# Patient Record
Sex: Male | Born: 1935 | Race: White | Hispanic: No | Marital: Married | Smoking: Never smoker
Health system: Southern US, Community
[De-identification: ages and names within clinical notes are randomized; demographics above are authoritative.]

## PROBLEM LIST (undated history)

## (undated) ENCOUNTER — Emergency Department: Discharge status: Absent without leave | Payer: Medicare Other

## (undated) DIAGNOSIS — N200 Calculus of kidney: Secondary | ICD-10-CM

## (undated) DIAGNOSIS — F0390 Unspecified dementia without behavioral disturbance: Secondary | ICD-10-CM

## (undated) HISTORY — PX: TONSILLECTOMY: SUR1361

## (undated) HISTORY — PX: CHOLECYSTECTOMY: SHX55

## (undated) HISTORY — PX: APPENDECTOMY: SHX54

---

## 2003-02-28 ENCOUNTER — Other Ambulatory Visit: Payer: Self-pay

## 2003-03-01 ENCOUNTER — Other Ambulatory Visit: Payer: Self-pay

## 2003-12-05 ENCOUNTER — Other Ambulatory Visit: Payer: Self-pay

## 2003-12-05 ENCOUNTER — Inpatient Hospital Stay: Payer: Self-pay | Admitting: Internal Medicine

## 2003-12-09 ENCOUNTER — Inpatient Hospital Stay (HOSPITAL_COMMUNITY): Admission: EM | Admit: 2003-12-09 | Discharge: 2003-12-12 | Payer: Self-pay | Admitting: Gastroenterology

## 2004-08-15 ENCOUNTER — Ambulatory Visit: Payer: Self-pay | Admitting: Internal Medicine

## 2005-12-28 ENCOUNTER — Emergency Department: Payer: Self-pay | Admitting: Emergency Medicine

## 2006-01-10 ENCOUNTER — Emergency Department: Payer: Self-pay | Admitting: General Practice

## 2006-01-19 ENCOUNTER — Emergency Department: Payer: Self-pay | Admitting: General Practice

## 2014-11-09 ENCOUNTER — Other Ambulatory Visit: Payer: Self-pay | Admitting: Family Medicine

## 2014-11-09 DIAGNOSIS — R413 Other amnesia: Secondary | ICD-10-CM

## 2014-11-19 ENCOUNTER — Ambulatory Visit
Admission: RE | Admit: 2014-11-19 | Discharge: 2014-11-19 | Disposition: A | Discharge status: Home / Self Care | Payer: Medicare Other | Source: Ambulatory Visit | Attending: Family Medicine | Admitting: Family Medicine

## 2014-11-19 DIAGNOSIS — R9082 White matter disease, unspecified: Secondary | ICD-10-CM | POA: Insufficient documentation

## 2014-11-19 DIAGNOSIS — I679 Cerebrovascular disease, unspecified: Secondary | ICD-10-CM | POA: Insufficient documentation

## 2014-11-19 DIAGNOSIS — R413 Other amnesia: Secondary | ICD-10-CM | POA: Insufficient documentation

## 2019-09-03 ENCOUNTER — Other Ambulatory Visit: Payer: Self-pay | Admitting: Family Medicine

## 2019-09-03 DIAGNOSIS — R3129 Other microscopic hematuria: Secondary | ICD-10-CM

## 2019-09-03 DIAGNOSIS — R1032 Left lower quadrant pain: Secondary | ICD-10-CM

## 2019-09-25 ENCOUNTER — Ambulatory Visit
Admission: RE | Admit: 2019-09-25 | Discharge: 2019-09-25 | Disposition: A | Discharge status: Home / Self Care | Payer: Medicare Other | Source: Ambulatory Visit | Attending: Family Medicine | Admitting: Family Medicine

## 2019-09-25 ENCOUNTER — Other Ambulatory Visit: Payer: Self-pay

## 2019-09-25 DIAGNOSIS — R1032 Left lower quadrant pain: Secondary | ICD-10-CM | POA: Insufficient documentation

## 2019-09-25 DIAGNOSIS — R3129 Other microscopic hematuria: Secondary | ICD-10-CM

## 2019-10-08 ENCOUNTER — Emergency Department: Payer: Medicare Other

## 2019-10-08 ENCOUNTER — Encounter: Payer: Self-pay | Admitting: Emergency Medicine

## 2019-10-08 ENCOUNTER — Other Ambulatory Visit: Payer: Self-pay

## 2019-10-08 ENCOUNTER — Emergency Department
Admission: EM | Admit: 2019-10-08 | Discharge: 2019-10-08 | Disposition: A | Discharge status: Home / Self Care | Payer: Medicare Other | Attending: Emergency Medicine | Admitting: Emergency Medicine

## 2019-10-08 DIAGNOSIS — Z79899 Other long term (current) drug therapy: Secondary | ICD-10-CM | POA: Diagnosis not present

## 2019-10-08 DIAGNOSIS — I700 Atherosclerosis of aorta: Secondary | ICD-10-CM | POA: Diagnosis not present

## 2019-10-08 DIAGNOSIS — W19XXXA Unspecified fall, initial encounter: Secondary | ICD-10-CM | POA: Diagnosis not present

## 2019-10-08 DIAGNOSIS — N200 Calculus of kidney: Secondary | ICD-10-CM | POA: Diagnosis not present

## 2019-10-08 DIAGNOSIS — F0390 Unspecified dementia without behavioral disturbance: Secondary | ICD-10-CM | POA: Insufficient documentation

## 2019-10-08 DIAGNOSIS — K5790 Diverticulosis of intestine, part unspecified, without perforation or abscess without bleeding: Secondary | ICD-10-CM | POA: Diagnosis not present

## 2019-10-08 DIAGNOSIS — S32010A Wedge compression fracture of first lumbar vertebra, initial encounter for closed fracture: Secondary | ICD-10-CM | POA: Diagnosis not present

## 2019-10-08 DIAGNOSIS — K449 Diaphragmatic hernia without obstruction or gangrene: Secondary | ICD-10-CM | POA: Insufficient documentation

## 2019-10-08 DIAGNOSIS — S34109A Unspecified injury to unspecified level of lumbar spinal cord, initial encounter: Secondary | ICD-10-CM | POA: Diagnosis present

## 2019-10-08 DIAGNOSIS — N1330 Unspecified hydronephrosis: Secondary | ICD-10-CM | POA: Insufficient documentation

## 2019-10-08 DIAGNOSIS — N400 Enlarged prostate without lower urinary tract symptoms: Secondary | ICD-10-CM | POA: Insufficient documentation

## 2019-10-08 HISTORY — DX: Calculus of kidney: N20.0

## 2019-10-08 HISTORY — DX: Unspecified dementia, unspecified severity, without behavioral disturbance, psychotic disturbance, mood disturbance, and anxiety: F03.90

## 2019-10-08 LAB — URINALYSIS, COMPLETE (UACMP) WITH MICROSCOPIC
Bilirubin Urine: NEGATIVE
Glucose, UA: NEGATIVE mg/dL
Ketones, ur: 20 mg/dL — AB
Nitrite: NEGATIVE
Protein, ur: 30 mg/dL — AB
RBC / HPF: >50 RBC/hpf — ABNORMAL HIGH (ref 0–5)
Specific Gravity, Urine: 1.025 (ref 1.005–1.030)
pH: 5 (ref 5.0–8.0)

## 2019-10-08 LAB — CBC WITH DIFFERENTIAL/PLATELET
Abs Immature Granulocytes: 0.18 10*3/uL — ABNORMAL HIGH (ref 0.00–0.07)
Basophils Absolute: 0 10*3/uL (ref 0.0–0.1)
Basophils Relative: 0 %
Eosinophils Absolute: 0 10*3/uL (ref 0.0–0.5)
Eosinophils Relative: 0 %
HCT: 55.5 % — ABNORMAL HIGH (ref 39.0–52.0)
Hemoglobin: 18.4 g/dL — ABNORMAL HIGH (ref 13.0–17.0)
Immature Granulocytes: 1 %
Lymphocytes Relative: 6 %
Lymphs Abs: 0.9 10*3/uL (ref 0.7–4.0)
MCH: 30.5 pg (ref 26.0–34.0)
MCHC: 33.2 g/dL (ref 30.0–36.0)
MCV: 92 fL (ref 80.0–100.0)
Monocytes Absolute: 1.5 10*3/uL — ABNORMAL HIGH (ref 0.1–1.0)
Monocytes Relative: 10 %
Neutro Abs: 13.3 10*3/uL — ABNORMAL HIGH (ref 1.7–7.7)
Neutrophils Relative %: 83 %
Platelets: 248 10*3/uL (ref 150–400)
RBC: 6.03 MIL/uL — ABNORMAL HIGH (ref 4.22–5.81)
RDW: 13.6 % (ref 11.5–15.5)
WBC: 15.9 10*3/uL — ABNORMAL HIGH (ref 4.0–10.5)
nRBC: 0 % (ref 0.0–0.2)

## 2019-10-08 LAB — COMPREHENSIVE METABOLIC PANEL
ALT: 31 U/L (ref 0–44)
AST: 46 U/L — ABNORMAL HIGH (ref 15–41)
Albumin: 4.5 g/dL (ref 3.5–5.0)
Alkaline Phosphatase: 72 U/L (ref 38–126)
Anion gap: 16 — ABNORMAL HIGH (ref 5–15)
BUN: 21 mg/dL (ref 8–23)
CO2: 19 mmol/L — ABNORMAL LOW (ref 22–32)
Calcium: 9.6 mg/dL (ref 8.9–10.3)
Chloride: 100 mmol/L (ref 98–111)
Creatinine, Ser: 1.38 mg/dL — ABNORMAL HIGH (ref 0.61–1.24)
GFR calc Af Amer: 53 mL/min — ABNORMAL LOW (ref >60)
GFR calc non Af Amer: 46 mL/min — ABNORMAL LOW (ref >60)
Glucose, Bld: 225 mg/dL — ABNORMAL HIGH (ref 70–99)
Potassium: 4.8 mmol/L (ref 3.5–5.1)
Sodium: 135 mmol/L (ref 135–145)
Total Bilirubin: 1.5 mg/dL — ABNORMAL HIGH (ref 0.3–1.2)
Total Protein: 8.6 g/dL — ABNORMAL HIGH (ref 6.5–8.1)

## 2019-10-08 IMAGING — CT CT RENAL STONE PROTOCOL
2 of 4 series · 15 of 46 positions shown, 17 images · non-contrast
Comparison: [DATE]

CLINICAL DATA: Fall, history kidney stones, dementia

EXAM:
CT ABDOMEN AND PELVIS WITHOUT CONTRAST
TECHNIQUE: Multidetector CT imaging of the abdomen and pelvis was performed
following the standard protocol without IV contrast. Sagittal and
coronal MPR images reconstructed from axial data set. Oral contrast
not administered for this indication.

[Series 2: stone full standard · axial · 0.74mm/px · z∈[-405,+50]mm · 12 of 101 slices shown, 14 images]
[im 5/101  soft-tissue]
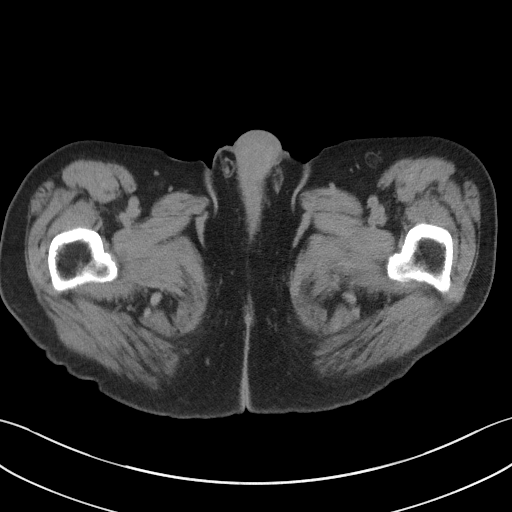
[im 5/101  bone]
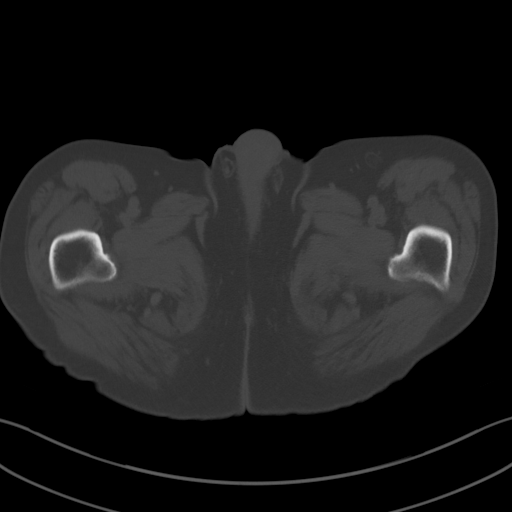
[im 14/101  soft-tissue]
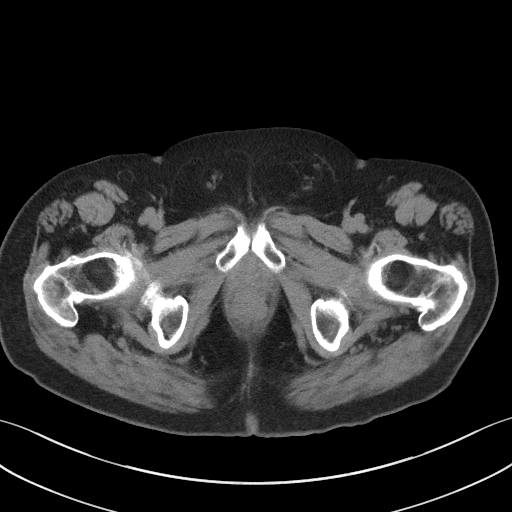
[im 22/101  soft-tissue]
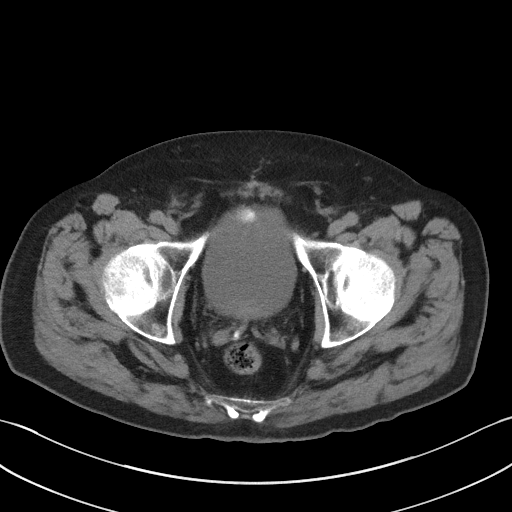
[im 31/101  soft-tissue]
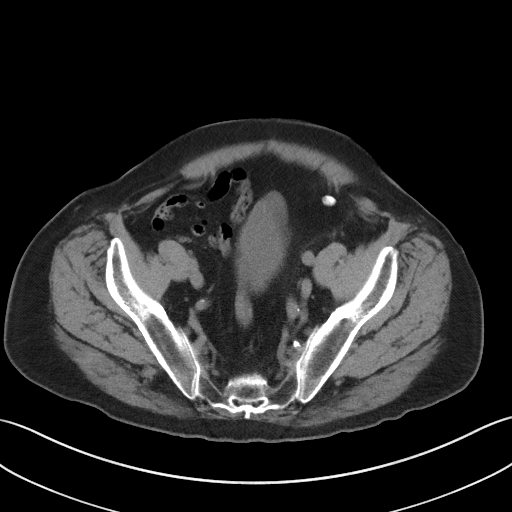
[im 40/101  soft-tissue]
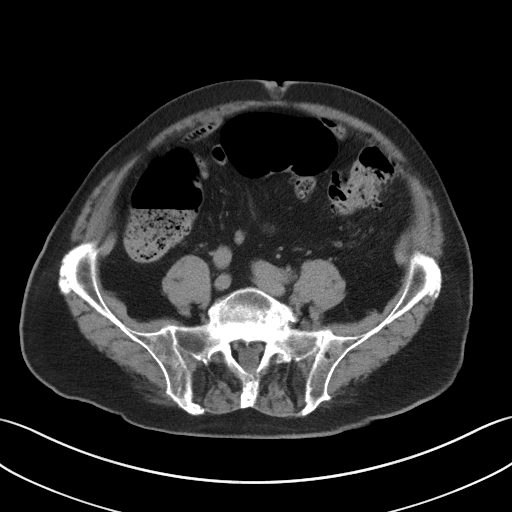
[im 48/101  soft-tissue]
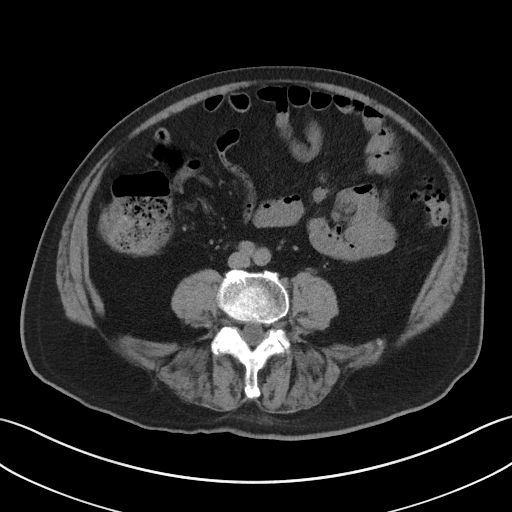
[im 53/101  soft-tissue]
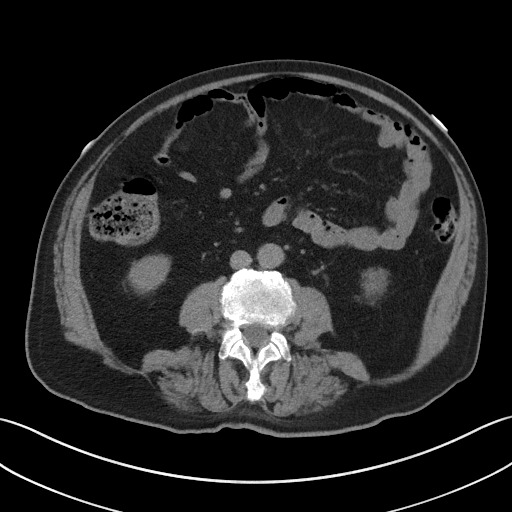
[im 61/101  soft-tissue]
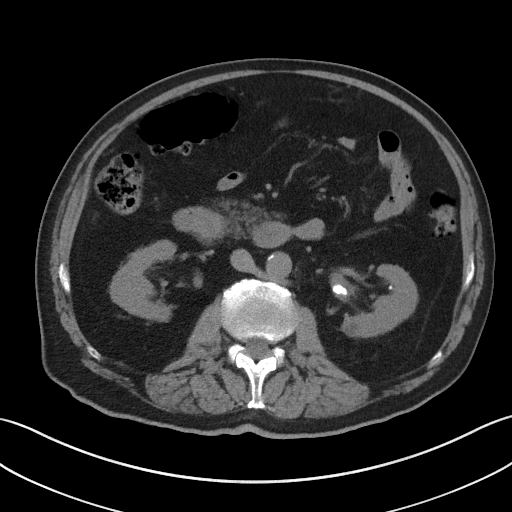
[im 70/101  soft-tissue]
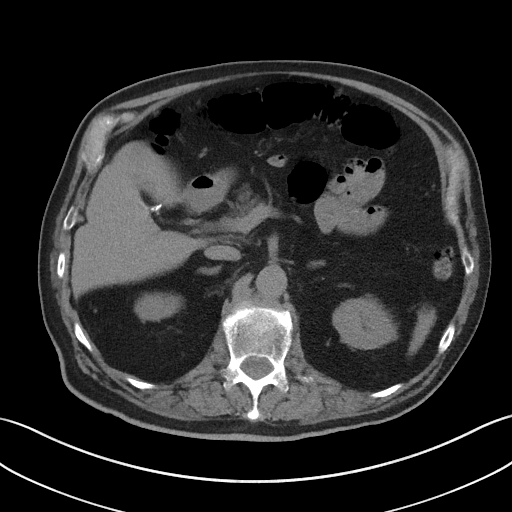
[im 70/101  bone]
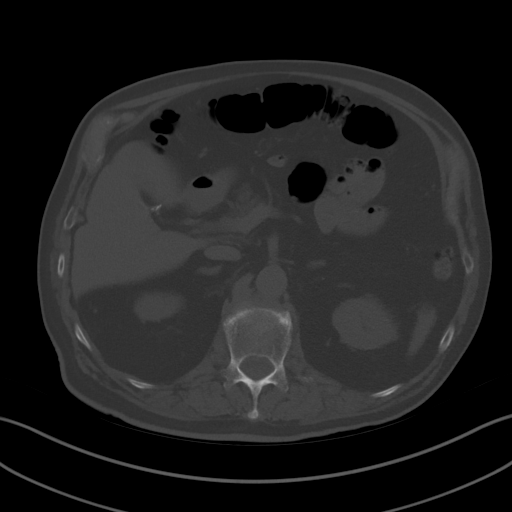
[im 79/101  soft-tissue]
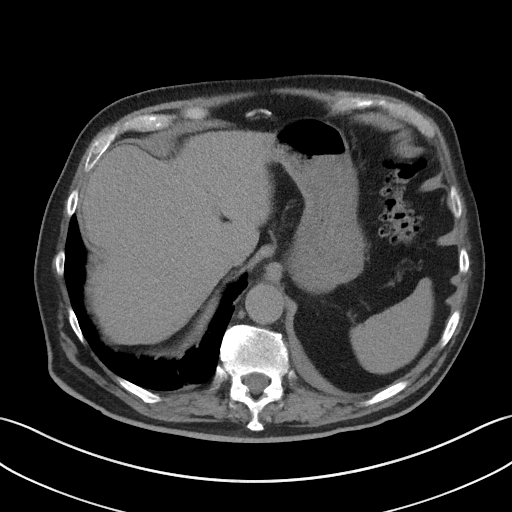
[im 87/101  soft-tissue]
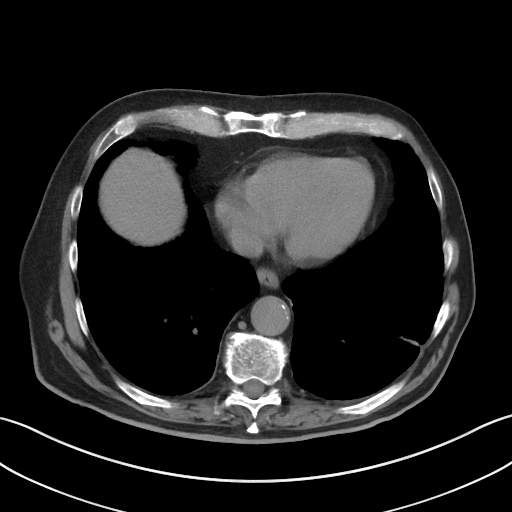
[im 96/101  soft-tissue]
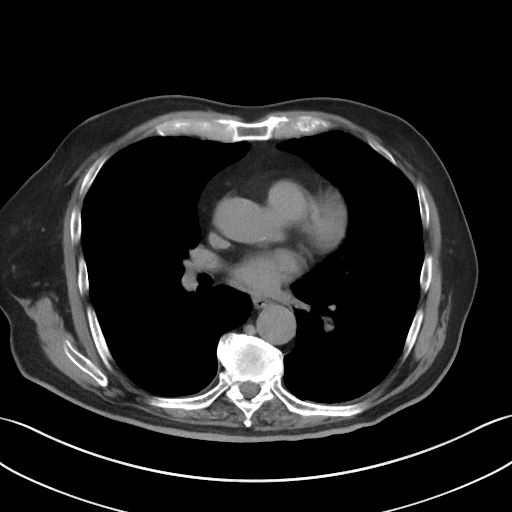

[Series 5: coronal · coronal · 0.75mm/px · 3 of 145 slices shown]
[im 49/145  soft-tissue]
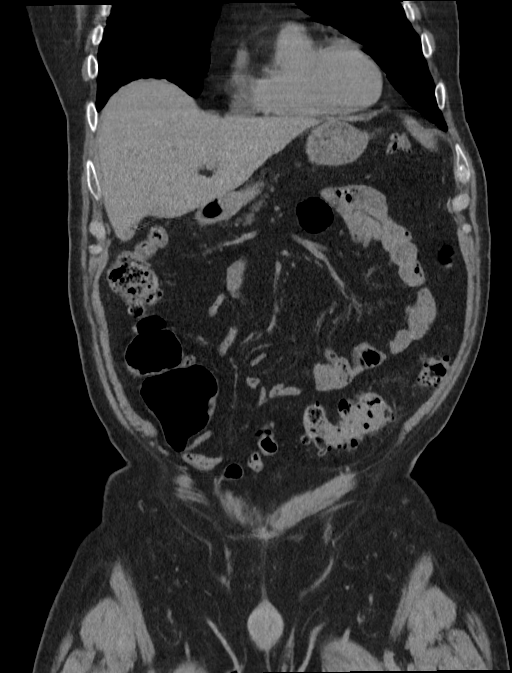
[im 65/145  soft-tissue]
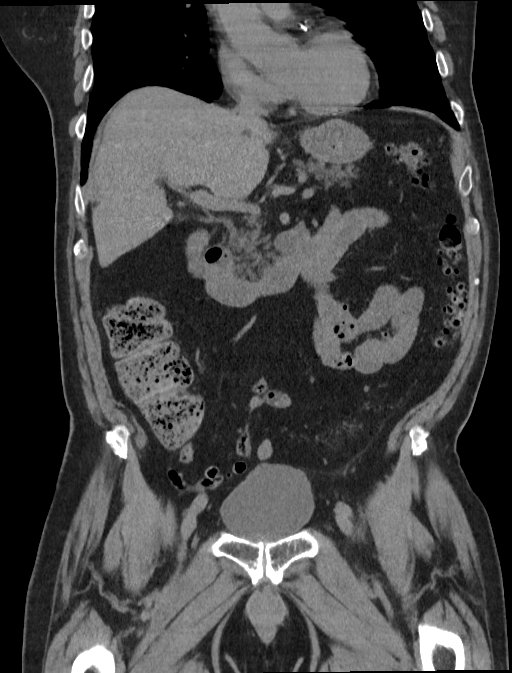
[im 81/145  soft-tissue]
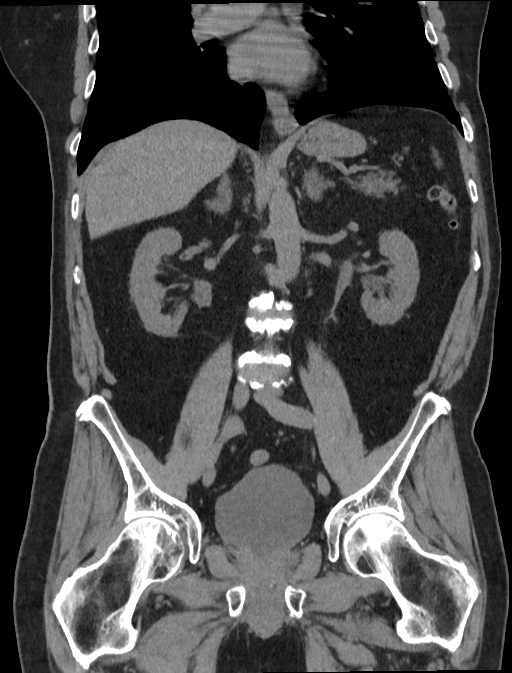

[15 of 46 positions shown; findings below may reference images not displayed]

FINDINGS: Lower chest: Linear subsegmental atelectasis BILATERAL lower lobes
greater on LEFT

Hepatobiliary: Gallbladder surgically absent.  Liver unremarkable.

Pancreas: Normal appearance

Spleen: Normal appearance

Adrenals/Urinary Tract: Adrenal glands normal appearance. No renal
mass. Multiple BILATERAL renal calculi including and 11 x 6 x 13 mm
calculus at the LEFT renal pelvis and a 7 x 5 x 8 mm calculus at the
RIGHT renal pelvis. Mild LEFT hydronephrosis. No ureteral
calcification or dilatation. Bladder unremarkable.

Stomach/Bowel: Diverticulosis of descending and sigmoid colon.
Minimal pericolic infiltration at proximal sigmoid colon, appears
chronic, without associated colonic wall thickening. Normal appendix
best appreciated on coronal images adjacent to cecum. Small hiatal
hernia. Remaining stomach and bowel loops normal appearance.

Vascular/Lymphatic: Atherosclerotic calcifications aorta and iliac
arteries without aneurysm. Scattered pelvic phleboliths. No
adenopathy. Mild coronary arterial calcifications. Retroaortic LEFT
renal vein.

Reproductive: Prostatic enlargement gland measuring 4.9 x 4.8 cm
image 85, with central low attenuation question prior TURP.

Other: Tiny umbilical hernia containing fat. No free air or free
fluid.

Musculoskeletal: Osseous demineralization. New superior endplate
compression fracture of L1 vertebral body with minimal anterior
height loss and minimal retropulsion of a posterior endplate
fragment superiorly.
IMPRESSION: Multiple BILATERAL renal calculi including a 7 x 5 x 8 mm calculus
at the RIGHT renal pelvis and a 11 x 6 x 13 mm calculus at the LEFT
renal pelvis with associated mild LEFT hydronephrosis.

Distal colonic diverticulosis with minimal pericolic infiltration at
proximal sigmoid colon, appears chronic, without associated colonic
wall thickening to suggest acute diverticulitis.

Prostatic enlargement with question prior TURP.

Small hiatal hernia.

New superior endplate compression fracture of L1 vertebral body with
minimal anterior height loss and minimal retropulsion of a posterior
endplate fragment superiorly.

Aortic Atherosclerosis ([ZU]-[ZU]).

## 2019-10-08 MED ORDER — TRAMADOL HCL 50 MG PO TABS
50.0000 mg | ORAL_TABLET | Freq: Once | ORAL | Status: AC
  Administered 2019-10-08: 50 mg via ORAL
  Filled 2019-10-08: qty 1

## 2019-10-08 MED ORDER — SODIUM CHLORIDE 0.9 % IV BOLUS
1000.0000 mL | Freq: Once | INTRAVENOUS | Status: AC
  Administered 2019-10-08: 1000 mL via INTRAVENOUS

## 2019-10-08 MED ORDER — TRAMADOL HCL 50 MG PO TABS: 50.0000 mg | ORAL_TABLET | Freq: Four times a day (QID) | ORAL | 0 refills | Status: AC | PRN

## 2019-10-08 NOTE — ED Notes (Signed)
Urine cup given, he is not able to urinate at this time.

## 2019-10-08 NOTE — ED Triage Notes (Signed)
Pt in via EMS from home with c/o fall on Tuesday and back pain. Per family pt not able to move around like he could. ST 126, Hx of dementia and kidney stones.

## 2019-10-08 NOTE — ED Provider Notes (Signed)
[facility] Emergency Department Provider Note  Time seen: 12:50 PM  I have reviewed the triage vital signs and the nursing notes.   HISTORY  Chief Complaint Fall and Back Pain   HPI Harold Barber is a 86 y.o. male with a past medical history of dementia, presents emergency department for back pain.  According to report patient is coming from home via EMS where he had a fall on Tuesday.  Since that time he has been complaining of increased back pain and having difficulty moving around due to back pain.  No report of head injury.  Here the patient is awake alert, he does have dementia when asked why he is here he states he is not sure.  Of asked if he is having back pain he states yes "because you have not fed me."   Patient does not appear to be in any discomfort.  Is currently in a wheelchair but is able to get up with assistance to sit in the bed.  Past Medical History:  Diagnosis Date  . Dementia (HCC)   . Kidney stones     There are no problems to display for this patient.   History reviewed. No pertinent surgical history.  Prior to Admission medications   Medication Sig Start Date End Date Taking? Authorizing Provider  donepezil (ARICEPT) 5 MG tablet Take 5 mg by mouth at bedtime.   Yes [provider]  memantine (NAMENDA) 5 MG tablet Take 5 mg by mouth 2 (two) times daily.   Yes [provider]    No Known Allergies  No family history on file.  Social History Social History   Tobacco Use  . Smoking status: Unknown If Ever Smoked  . Smokeless tobacco: Never Used  Substance Use Topics  . Alcohol use: Not on file  . Drug use: Not on file    Review of Systems Unable to obtain adequate/accurate review of systems secondary to baseline dementia.  ____________________________________________   PHYSICAL EXAM:  VITAL SIGNS: ED Triage Vitals  Enc Vitals Group     BP 10/08/19 0916 113/77     Pulse Rate 10/08/19 0916 (!) 126      Resp 10/08/19 0916 18     Temp 10/08/19 0916 98.1 F (36.7 C)     Temp Source 10/08/19 0916 Oral     SpO2 10/08/19 0916 94 %     Weight 10/08/19 0924 175 lb (79.4 kg)     Height 10/08/19 0924 5\' 10"  (1.778 m)     Head Circumference --      Peak Flow --      Pain Score 10/08/19 0923 8     Pain Loc --      Pain Edu? --      Excl. in GC? --    Constitutional: Patient is awake alert, no acute distress.  Is asking for something to eat. Eyes: Normal exam ENT      Head: Normocephalic and atraumatic.      Mouth/Throat: Mucous membranes are moist. Cardiovascular: Normal rate, regular rhythm around 100 bpm.  Respiratory: Normal respiratory effort without tachypnea nor retractions. Breath sounds are clear Gastrointestinal: Soft and nontender. No distention. Musculoskeletal: No back tenderness to palpation, no step-offs or deformities. Neurologic:  Normal speech and language. No gross focal neurologic deficits  Skin:  Skin is warm, dry and intact.  Psychiatric: Mood and affect are normal.  ____________________________________________    EKG  EKG viewed and interpreted by myself shows  sinus tachycardia 124 bpm with a narrow QRS, normal axis, normal intervals, nonspecific ST changes.  ____________________________________________    RADIOLOGY  L1 compression fracture.  ____________________________________________   INITIAL IMPRESSION / ASSESSMENT AND PLAN / ED COURSE  Pertinent labs & imaging results that were available during my care of the patient were reviewed by me and considered in my medical decision making (see chart for details).   Patient presents emergency department for reported back pain after a fall on Tuesday.  Patient has dementia and cannot contribute meaningfully to his history or review of systems.  Does not appear to have any tenderness on back palpation no step-offs or deformities.  No abdominal tenderness no chest tenderness.  However given the complaint of  worsening back pain at home with difficulty ambulating we will obtain thoracic x-ray as well as a CT renal scan to evaluate patient's back and pelvis.  Patient does have an anion gap of 16 slight renal insufficiency we will IV hydrate while awaiting results.  Patient is receiving IV fluids.  CT scan does show an L1 compression fracture.  I spoke to the wife regarding this, we will prescribe a short course of tramadol for pain management.  We will await urinalysis as well.  Offered admission however the wife does state she believes she can care for the patient adequately at home, which I believe is appropriate if she is willing.  Did discuss return precautions.  Patient care signed out to Dr. Scotty Court urinalysis pending.  Harold Barber was evaluated in Emergency Department on 10/08/2019 for the symptoms described in the history of present illness. He was evaluated in the context of the global COVID-19 pandemic, which necessitated consideration that the patient might be at risk for infection with the SARS-CoV-2 virus that causes COVID-19. Institutional protocols and algorithms that pertain to the evaluation of patients at risk for COVID-19 are in a state of rapid change based on information released by regulatory bodies including the CDC and federal and state organizations. These policies and algorithms were followed during the patient's care in the ED.  ____________________________________________   FINAL CLINICAL IMPRESSION(S) / ED DIAGNOSES  Compression fracture   Minna Antis, MD 10/08/19 1512

## 2019-10-08 NOTE — ED Notes (Signed)
Pt in XRAY 

## 2019-10-08 NOTE — ED Notes (Signed)
Rainbow sent to the lab.  

## 2019-10-08 NOTE — ED Notes (Signed)
Pt taken to restroom, attempted to provide urine sample, unable to at this time.  Pt oriented to person only.  Not answering questions appropriately, unable to answer why presents to ED

## 2019-10-08 NOTE — ED Provider Notes (Signed)
Procedures     ----------------------------------------- 4:29 PM on 10/08/2019 -----------------------------------------   Urinalysis negative for infection.  Patient's presentation appears to be due to dehydration and pain related to his mild L1 compression fracture.  Not septic.  CT also shows large bilateral kidney stone on both sides causing some microscopic hematuria, not requiring urgent intervention without signs of infection, creatinine at baseline.  Will recommend follow-up with urology in addition to orthopedics.   Sharman Cheek, MD 10/08/19 1630

## 2019-10-08 NOTE — Discharge Instructions (Addendum)
Please call the number for orthopedics to arrange a follow-up appointment in the next week for recheck/reevaluation.  Return to the emergency department for any worsening pain, inability to ambulate, or any other symptom personally concerning to yourself or family member.  Please take pain medication as needed, but only as prescribed.  You should follow up with urology as well for further evaluation of large kidney stones seen on both sides that are causing some blood in the urine.  Your urine test does not show any signs of infection.

## 2019-10-09 ENCOUNTER — Encounter: Payer: Self-pay | Admitting: Urology

## 2019-10-09 ENCOUNTER — Ambulatory Visit (INDEPENDENT_AMBULATORY_CARE_PROVIDER_SITE_OTHER): Payer: Medicare Other | Admitting: Urology

## 2019-10-09 VITALS — BP 111/79 | HR 123 | Ht 65.0 in | Wt 160.0 lb

## 2019-10-09 DIAGNOSIS — N200 Calculus of kidney: Secondary | ICD-10-CM

## 2019-10-09 NOTE — Progress Notes (Signed)
10/09/2019 2:19 PM   Harold Barber October 14, 1930 [id number]  Referring provider: Gayland Curry, MD [address] [place],  [place] [address] Chief Complaint  Patient presents with  . New Patient (Initial Visit)    ED follow-up for kidney stones    HPI: Harold Barber is a 86 y.o. male who presents today for evaluation and management of kidney stones. The patient is poor historian secondary to dementia. Wife is main historian and accompanies him today along with their daughter.   CT renal stone study on 09/25/2019 revealed bilateral nephrolithiasis. No hydronephrosis or renal obstruction is noted. Diverticulosis is noted throughout the colon. Mild inflammatory changes are noted around the junction of the descending and sigmoid colon suggesting mild focal diverticulitis. Mild prostatic enlargement.  He was presented to the ED yesterday for back pain secondary to a fall. He had increased back pain and having difficulty moving around due to back pain. Patient did have an anion gap of 16 slight renal insufficiency and was treated with IV fluids. UA noted large hematuria, >50 RBC, 6-10 WBC, rare bacteria and hyaline casts present. Bilateral kidney stones found on CT were thought to be causing some microscopic hematuria. Patient was treated with short course tramadol.  CT renal stone study 10/08/2019 noted multiple BILATERAL renal calculi including a 7 x 5 x 8 mm calculus at the RIGHT renal pelvis and a 11 x 6 x 13 mm calculus at the LEFT renal pelvis with associated mild LEFT hydronephrosis. New superior endplate compression fracture of L1 vertebral body with minimal anterior height loss and minimal retropulsion of a posterior endplate fragment superiorly.  His wife noticed some improvement after he received IV fluids yesterday. She notes that he has had blood in his urine for a while now but its has not been frankly red, more darker or brownish possibly even from concentration rather than gross  blood. He is in pain related to compression fracture of L1 vertebrae s/p fall.   Patient is on Flomax per PCP to help pass stones.   PMH: Past Medical History:  Diagnosis Date  . Dementia ([place])   . Kidney stones     Surgical History: No past surgical history on file.  Home Medications:  Allergies as of 10/09/2019      Reactions   Statins    Other reaction(s): Other (See Comments) Medically inappropriate. Advanced dementia      Medication List       Accurate as of October 09, 2019 11:59 PM. If you have any questions, ask your nurse or doctor.        donepezil 10 MG tablet Commonly known as: ARICEPT Take 1 tablet by mouth daily. What changed: Another medication with the same name was removed. Continue taking this medication, and follow the directions you see here. Changed by: Hollice Espy, MD   memantine 5 MG tablet Commonly known as: NAMENDA Take 5 mg by mouth 2 (two) times daily.   metroNIDAZOLE 500 MG tablet Commonly known as: FLAGYL Take 1 tablet by mouth in the morning, at noon, and at bedtime.   sulfamethoxazole-trimethoprim 800-160 MG tablet Commonly known as: BACTRIM DS Take 1 tablet by mouth in the morning and at bedtime.   tamsulosin 0.4 MG Caps capsule Commonly known as: FLOMAX Take 0.4 mg by mouth daily.   traMADol 50 MG tablet Commonly known as: Ultram Take 1 tablet (50 mg total) by mouth every 6 (six) hours as needed.       Allergies:  Allergies  Allergen Reactions  . Statins     Other reaction(s): Other (See Comments) Medically inappropriate. Advanced dementia    Family History: No family history on file.  Social History:  reports that he has never smoked. He has never used smokeless tobacco. He reports that he does not drink alcohol and does not use drugs.   Physical Exam: BP 111/79   Pulse (!) 123   Ht _0  (1.651 m)   Wt 160 lb (72.6 kg)   BMI 26.63 kg/m   Constitutional:. Frail appearing in wheelchair.  Not oriented.   Accompanied by wife and daughter.   HEENT: [place] AT, moist mucus membranes.  Trachea midline, no masses. Cardiovascular: No clubbing, cyanosis, or edema. Respiratory: Normal respiratory effort, no increased work of breathing. Skin: No rashes, bruises or suspicious lesions. Neurologic: Grossly intact, no focal deficits, moving all 4 extremities. Psychiatric: Disorientated.  Laboratory Data:  Lab Results  Component Value Date   CREATININE 1.38 (H) 10/08/2019     Pertinent Imaging:  Results for orders placed during the hospital encounter of 10/08/19  CT Renal Stone Study  Narrative CLINICAL DATA:  Fall, history kidney stones, dementia  EXAM: CT ABDOMEN AND PELVIS WITHOUT CONTRAST  TECHNIQUE: Multidetector CT imaging of the abdomen and pelvis was performed following the standard protocol without IV contrast. Sagittal and coronal MPR images reconstructed from axial data set. Oral contrast not administered for this indication.  COMPARISON:  09/25/2019  FINDINGS: Lower chest: Linear subsegmental atelectasis BILATERAL lower lobes greater on LEFT  Hepatobiliary: Gallbladder surgically absent.  Liver unremarkable.  Pancreas: Normal appearance  Spleen: Normal appearance  Adrenals/Urinary Tract: Adrenal glands normal appearance. No renal mass. Multiple BILATERAL renal calculi including and 11 x 6 x 13 mm calculus at the LEFT renal pelvis and a 7 x 5 x 8 mm calculus at the RIGHT renal pelvis. Mild LEFT hydronephrosis. No ureteral calcification or dilatation. Bladder unremarkable.  Stomach/Bowel: Diverticulosis of descending and sigmoid colon. Minimal pericolic infiltration at proximal sigmoid colon, appears chronic, without associated colonic wall thickening. Normal appendix best appreciated on coronal images adjacent to cecum. Small hiatal hernia. Remaining stomach and bowel loops normal appearance.  Vascular/Lymphatic: Atherosclerotic calcifications aorta and  iliac arteries without aneurysm. Scattered pelvic phleboliths. No adenopathy. Mild coronary arterial calcifications. Retroaortic LEFT renal vein.  Reproductive: Prostatic enlargement gland measuring 4.9 x 4.8 cm image 85, with central low attenuation question prior TURP.  Other: Tiny umbilical hernia containing fat. No free air or free fluid.  Musculoskeletal: Osseous demineralization. New superior endplate compression fracture of L1 vertebral body with minimal anterior height loss and minimal retropulsion of a posterior endplate fragment superiorly.  IMPRESSION: Multiple BILATERAL renal calculi including a 7 x 5 x 8 mm calculus at the RIGHT renal pelvis and a 11 x 6 x 13 mm calculus at the LEFT renal pelvis with associated mild LEFT hydronephrosis.  Distal colonic diverticulosis with minimal pericolic infiltration at proximal sigmoid colon, appears chronic, without associated colonic wall thickening to suggest acute diverticulitis.  Prostatic enlargement with question prior TURP.  Small hiatal hernia.  New superior endplate compression fracture of L1 vertebral body with minimal anterior height loss and minimal retropulsion of a posterior endplate fragment superiorly.  Aortic Atherosclerosis (ICD10-I70.0).   Electronically Signed By: Lavonia Dana M.D. On: 10/08/2019 13:49  I have personally reviewed the images and agree with radiologist interpretation. I personally measured the left renal stone and found it to be 1300 Hu. Stone to skin distance was 10  cm. I also shared images with patient and family.     Assessment & Plan:    1. Microscopic hematuria -Likely related to  stone -Discussed flexible cystoscopy but family deferred and they understand the risk of misdiagnosed bladder cancer. -Given his lack of symptoms, age and comorbidities the family would like to continue conservative management. They were advised to RTC if he has flank or abdominal pain, gross hematuria  and fever.   2. Bilateral nephrolithiasis -Discontinue Flomax, stones are not currently in ureter or amenable to MET -Cr near baseline, suspect intermittent obstruction  -Based on size and location of the stone, patient has ~0% of spontaneous interval stone passage over 30-day period. -We discussed general stone prevention techniques including drinking plenty water with goal of producing 2.5 L urine daily, increased citric acid intake, avoidance of high oxalate containing foods, and decreased salt intake.  Information about dietary recommendations given today.  -Surgical intervention was deferred at this time due to lack of symptoms and comorbidities the family would like to continue care with PCP Dr. Astrid Divine for now.  Alternatives including ESWL vs. URS were discussed, family wants to avoid surgery that is absolutely not necessary. -Offered surveillance of stone, declined  F/u prn  [facility] [address], [place], [place] [address] [phone]  I, Selena Batten, am acting as a scribe for Dr. Hollice Espy.  I have reviewed the above documentation for accuracy and completeness, and I agree with the above.   Hollice Espy, MD  I spent 45 total minutes on the day of the encounter including pre-visit review of the medical record, face-to-face time with the patient, and post visit ordering of labs/imaging/tests.

## 2019-10-13 ENCOUNTER — Inpatient Hospital Stay: Payer: Medicare Other | Admitting: Anesthesiology

## 2019-10-13 ENCOUNTER — Other Ambulatory Visit: Payer: Self-pay

## 2019-10-13 ENCOUNTER — Emergency Department: Payer: Medicare Other

## 2019-10-13 ENCOUNTER — Encounter
Admission: EM | Disposition: A | Discharge status: Skilled Nursing Facility | Payer: Self-pay | Source: Home / Self Care | Attending: Surgery

## 2019-10-13 ENCOUNTER — Inpatient Hospital Stay: Payer: Medicare Other

## 2019-10-13 ENCOUNTER — Encounter: Payer: Self-pay | Admitting: Emergency Medicine

## 2019-10-13 ENCOUNTER — Inpatient Hospital Stay
Admission: EM | Admit: 2019-10-13 | Discharge: 2019-10-20 | DRG: 329 | Disposition: A | Discharge status: Skilled Nursing Facility | Payer: Medicare Other | Attending: Surgery | Admitting: Surgery

## 2019-10-13 DIAGNOSIS — Z9049 Acquired absence of other specified parts of digestive tract: Secondary | ICD-10-CM | POA: Diagnosis not present

## 2019-10-13 DIAGNOSIS — R198 Other specified symptoms and signs involving the digestive system and abdomen: Secondary | ICD-10-CM

## 2019-10-13 DIAGNOSIS — E878 Other disorders of electrolyte and fluid balance, not elsewhere classified: Secondary | ICD-10-CM | POA: Diagnosis present

## 2019-10-13 DIAGNOSIS — Z9181 History of falling: Secondary | ICD-10-CM

## 2019-10-13 DIAGNOSIS — Z79899 Other long term (current) drug therapy: Secondary | ICD-10-CM | POA: Diagnosis not present

## 2019-10-13 DIAGNOSIS — F0280 Dementia in other diseases classified elsewhere without behavioral disturbance: Secondary | ICD-10-CM | POA: Diagnosis present

## 2019-10-13 DIAGNOSIS — I493 Ventricular premature depolarization: Secondary | ICD-10-CM | POA: Diagnosis present

## 2019-10-13 DIAGNOSIS — N200 Calculus of kidney: Secondary | ICD-10-CM | POA: Diagnosis present

## 2019-10-13 DIAGNOSIS — K5720 Diverticulitis of large intestine with perforation and abscess without bleeding: Secondary | ICD-10-CM | POA: Diagnosis present

## 2019-10-13 DIAGNOSIS — A419 Sepsis, unspecified organism: Secondary | ICD-10-CM | POA: Diagnosis not present

## 2019-10-13 DIAGNOSIS — G309 Alzheimer's disease, unspecified: Secondary | ICD-10-CM | POA: Diagnosis present

## 2019-10-13 DIAGNOSIS — Z7401 Bed confinement status: Secondary | ICD-10-CM | POA: Diagnosis not present

## 2019-10-13 DIAGNOSIS — R739 Hyperglycemia, unspecified: Secondary | ICD-10-CM | POA: Diagnosis present

## 2019-10-13 DIAGNOSIS — R4182 Altered mental status, unspecified: Secondary | ICD-10-CM

## 2019-10-13 DIAGNOSIS — Z4659 Encounter for fitting and adjustment of other gastrointestinal appliance and device: Secondary | ICD-10-CM

## 2019-10-13 DIAGNOSIS — E870 Hyperosmolality and hypernatremia: Secondary | ICD-10-CM | POA: Diagnosis present

## 2019-10-13 DIAGNOSIS — Z0189 Encounter for other specified special examinations: Secondary | ICD-10-CM

## 2019-10-13 DIAGNOSIS — Z888 Allergy status to other drugs, medicaments and biological substances status: Secondary | ICD-10-CM | POA: Diagnosis not present

## 2019-10-13 DIAGNOSIS — Z87442 Personal history of urinary calculi: Secondary | ICD-10-CM | POA: Diagnosis not present

## 2019-10-13 DIAGNOSIS — G9341 Metabolic encephalopathy: Secondary | ICD-10-CM | POA: Diagnosis present

## 2019-10-13 DIAGNOSIS — K669 Disorder of peritoneum, unspecified: Secondary | ICD-10-CM | POA: Diagnosis present

## 2019-10-13 DIAGNOSIS — E8770 Fluid overload, unspecified: Secondary | ICD-10-CM | POA: Diagnosis not present

## 2019-10-13 DIAGNOSIS — Z20822 Contact with and (suspected) exposure to covid-19: Secondary | ICD-10-CM | POA: Diagnosis present

## 2019-10-13 DIAGNOSIS — Z01818 Encounter for other preprocedural examination: Secondary | ICD-10-CM

## 2019-10-13 DIAGNOSIS — E872 Acidosis: Secondary | ICD-10-CM | POA: Diagnosis present

## 2019-10-13 DIAGNOSIS — R109 Unspecified abdominal pain: Secondary | ICD-10-CM | POA: Diagnosis present

## 2019-10-13 HISTORY — PX: COLECTOMY WITH COLOSTOMY CREATION/HARTMANN PROCEDURE: SHX6598

## 2019-10-13 HISTORY — PX: LAPAROTOMY: SHX154

## 2019-10-13 LAB — BASIC METABOLIC PANEL
Anion gap: 15 (ref 5–15)
BUN: 45 mg/dL — ABNORMAL HIGH (ref 8–23)
CO2: 21 mmol/L — ABNORMAL LOW (ref 22–32)
Calcium: 9.5 mg/dL (ref 8.9–10.3)
Chloride: 106 mmol/L (ref 98–111)
Creatinine, Ser: 1.08 mg/dL (ref 0.61–1.24)
GFR calc Af Amer: >60 mL/min (ref >60)
GFR calc non Af Amer: >60 mL/min (ref >60)
Glucose, Bld: 175 mg/dL — ABNORMAL HIGH (ref 70–99)
Potassium: 4.3 mmol/L (ref 3.5–5.1)
Sodium: 142 mmol/L (ref 135–145)

## 2019-10-13 LAB — URINALYSIS, COMPLETE (UACMP) WITH MICROSCOPIC
Bacteria, UA: NONE SEEN
Bilirubin Urine: NEGATIVE
Glucose, UA: NEGATIVE mg/dL
Ketones, ur: 5 mg/dL — AB
Leukocytes,Ua: NEGATIVE
Nitrite: NEGATIVE
Protein, ur: NEGATIVE mg/dL
Specific Gravity, Urine: 1.02 (ref 1.005–1.030)
Squamous Epithelial / HPF: NONE SEEN (ref 0–5)
pH: 5 (ref 5.0–8.0)

## 2019-10-13 LAB — CBC
HCT: 56.5 % — ABNORMAL HIGH (ref 39.0–52.0)
Hemoglobin: 19.4 g/dL — ABNORMAL HIGH (ref 13.0–17.0)
MCH: 31.2 pg (ref 26.0–34.0)
MCHC: 34.3 g/dL (ref 30.0–36.0)
MCV: 90.8 fL (ref 80.0–100.0)
Platelets: 285 10*3/uL (ref 150–400)
RBC: 6.22 MIL/uL — ABNORMAL HIGH (ref 4.22–5.81)
RDW: 13.7 % (ref 11.5–15.5)
WBC: 22.9 10*3/uL — ABNORMAL HIGH (ref 4.0–10.5)
nRBC: 0 % (ref 0.0–0.2)

## 2019-10-13 LAB — BLOOD GAS, ARTERIAL
Acid-base deficit: 8.8 mmol/L — ABNORMAL HIGH (ref 0.0–2.0)
Bicarbonate: 16.1 mmol/L — ABNORMAL LOW (ref 20.0–28.0)
FIO2: 0.4
MECHVT: 450 mL
Mechanical Rate: 18
O2 Saturation: 96.3 %
PEEP: 5 cmH2O
Patient temperature: 37
RATE: 18 resp/min
pCO2 arterial: 32 mmHg (ref 32.0–48.0)
pH, Arterial: 7.31 — ABNORMAL LOW (ref 7.350–7.450)
pO2, Arterial: 92 mmHg (ref 83.0–108.0)

## 2019-10-13 LAB — MRSA PCR SCREENING: MRSA by PCR: NEGATIVE

## 2019-10-13 LAB — PROCALCITONIN: Procalcitonin: 0.3 ng/mL

## 2019-10-13 LAB — APTT: aPTT: 27 seconds (ref 24–36)

## 2019-10-13 LAB — LACTIC ACID, PLASMA
Lactic Acid, Venous: 1.5 mmol/L (ref 0.5–1.9)
Lactic Acid, Venous: 2.4 mmol/L (ref 0.5–1.9)

## 2019-10-13 LAB — PROTIME-INR
INR: 1.1 (ref 0.8–1.2)
Prothrombin Time: 14.1 seconds (ref 11.4–15.2)

## 2019-10-13 LAB — SARS CORONAVIRUS 2 BY RT PCR (HOSPITAL ORDER, PERFORMED IN ~~LOC~~ HOSPITAL LAB): SARS Coronavirus 2: NEGATIVE

## 2019-10-13 SURGERY — LAPAROTOMY, EXPLORATORY
Anesthesia: General

## 2019-10-13 MED ORDER — MIDAZOLAM HCL 2 MG/2ML IJ SOLN
1.0000 mg | INTRAMUSCULAR | Status: DC | PRN
  Administered 2019-10-14 (×2): 1 mg via INTRAVENOUS
  Filled 2019-10-13 (×2): qty 2

## 2019-10-13 MED ORDER — SUCCINYLCHOLINE CHLORIDE 20 MG/ML IJ SOLN
INTRAMUSCULAR | Status: DC | PRN
  Administered 2019-10-13: 120 mg via INTRAVENOUS

## 2019-10-13 MED ORDER — DONEPEZIL HCL 5 MG PO TABS
10.0000 mg | ORAL_TABLET | Freq: Every day | ORAL | Status: DC
  Filled 2019-10-13: qty 2

## 2019-10-13 MED ORDER — ROCURONIUM BROMIDE 100 MG/10ML IV SOLN
INTRAVENOUS | Status: DC | PRN
  Administered 2019-10-13: 40 mg via INTRAVENOUS
  Administered 2019-10-13: 50 mg via INTRAVENOUS
  Administered 2019-10-13: 30 mg via INTRAVENOUS

## 2019-10-13 MED ORDER — MEMANTINE HCL 5 MG PO TABS
5.0000 mg | ORAL_TABLET | Freq: Two times a day (BID) | ORAL | Status: DC
  Administered 2019-10-14: 5 mg via ORAL
  Filled 2019-10-13 (×2): qty 1

## 2019-10-13 MED ORDER — SODIUM CHLORIDE 0.9 % IV SOLN: 2.0000 g | Freq: Two times a day (BID) | INTRAVENOUS | Status: DC

## 2019-10-13 MED ORDER — MORPHINE SULFATE (PF) 4 MG/ML IV SOLN
4.0000 mg | INTRAVENOUS | Status: DC | PRN
  Administered 2019-10-13: 4 mg via INTRAVENOUS
  Filled 2019-10-13: qty 1

## 2019-10-13 MED ORDER — SODIUM CHLORIDE 0.9 % IV SOLN
INTRAVENOUS | Status: DC | PRN
  Administered 2019-10-13: 60 mL

## 2019-10-13 MED ORDER — ROCURONIUM BROMIDE 10 MG/ML (PF) SYRINGE
PREFILLED_SYRINGE | INTRAVENOUS | Status: AC
  Filled 2019-10-13: qty 10

## 2019-10-13 MED ORDER — ORAL CARE MOUTH RINSE
15.0000 mL | OROMUCOSAL | Status: DC
  Administered 2019-10-14 – 2019-10-15 (×12): 15 mL via OROMUCOSAL

## 2019-10-13 MED ORDER — CHLORHEXIDINE GLUCONATE CLOTH 2 % EX PADS
6.0000 | MEDICATED_PAD | Freq: Every day | CUTANEOUS | Status: DC
  Administered 2019-10-13 – 2019-10-17 (×4): 6 via TOPICAL

## 2019-10-13 MED ORDER — SODIUM CHLORIDE 0.9 % IV SOLN: Freq: Once | INTRAVENOUS | Status: AC

## 2019-10-13 MED ORDER — DOCUSATE SODIUM 50 MG/5ML PO LIQD
100.0000 mg | Freq: Two times a day (BID) | ORAL | Status: DC
  Administered 2019-10-14: 100 mg via ORAL
  Filled 2019-10-13: qty 10

## 2019-10-13 MED ORDER — POLYETHYLENE GLYCOL 3350 17 G PO PACK
17.0000 g | PACK | Freq: Every day | ORAL | Status: DC
  Filled 2019-10-13: qty 1

## 2019-10-13 MED ORDER — LIDOCAINE HCL (PF) 2 % IJ SOLN
INTRAMUSCULAR | Status: AC
  Filled 2019-10-13: qty 5

## 2019-10-13 MED ORDER — DEXAMETHASONE SODIUM PHOSPHATE 10 MG/ML IJ SOLN
INTRAMUSCULAR | Status: DC | PRN
  Administered 2019-10-13: 10 mg via INTRAVENOUS

## 2019-10-13 MED ORDER — SODIUM CHLORIDE 0.9 % IV SOLN: INTRAVENOUS | Status: DC | PRN

## 2019-10-13 MED ORDER — PANTOPRAZOLE SODIUM 40 MG IV SOLR
40.0000 mg | Freq: Every day | INTRAVENOUS | Status: DC
  Administered 2019-10-14 – 2019-10-19 (×7): 40 mg via INTRAVENOUS
  Filled 2019-10-13 (×7): qty 40

## 2019-10-13 MED ORDER — DEXAMETHASONE SODIUM PHOSPHATE 10 MG/ML IJ SOLN
INTRAMUSCULAR | Status: AC
  Filled 2019-10-13: qty 1

## 2019-10-13 MED ORDER — FENTANYL BOLUS VIA INFUSION
25.0000 ug | INTRAVENOUS | Status: DC | PRN
  Filled 2019-10-13: qty 25

## 2019-10-13 MED ORDER — FENTANYL CITRATE (PF) 100 MCG/2ML IJ SOLN
INTRAMUSCULAR | Status: DC | PRN
Start: 2019-10-13 — End: 2019-10-13
  Administered 2019-10-13 (×5): 50 ug via INTRAVENOUS

## 2019-10-13 MED ORDER — SODIUM CHLORIDE 0.9 % IV BOLUS
1000.0000 mL | Freq: Once | INTRAVENOUS | Status: AC
  Administered 2019-10-13: 1000 mL via INTRAVENOUS

## 2019-10-13 MED ORDER — KETOROLAC TROMETHAMINE 30 MG/ML IJ SOLN
15.0000 mg | Freq: Four times a day (QID) | INTRAMUSCULAR | Status: AC
  Administered 2019-10-14 – 2019-10-17 (×14): 15 mg via INTRAVENOUS
  Filled 2019-10-13 (×14): qty 1

## 2019-10-13 MED ORDER — LACTATED RINGERS IV SOLN: INTRAVENOUS | Status: DC

## 2019-10-13 MED ORDER — FENTANYL CITRATE (PF) 250 MCG/5ML IJ SOLN
INTRAMUSCULAR | Status: AC
  Filled 2019-10-13: qty 5

## 2019-10-13 MED ORDER — FENTANYL 2500MCG IN NS 250ML (10MCG/ML) PREMIX INFUSION: 25.0000 ug/h | INTRAVENOUS | Status: DC

## 2019-10-13 MED ORDER — METRONIDAZOLE IN NACL 5-0.79 MG/ML-% IV SOLN
500.0000 mg | Freq: Three times a day (TID) | INTRAVENOUS | Status: DC
  Administered 2019-10-13: 500 mg via INTRAVENOUS

## 2019-10-13 MED ORDER — MIDAZOLAM HCL 5 MG/5ML IJ SOLN
INTRAMUSCULAR | Status: DC | PRN
  Administered 2019-10-13: 2 mg via INTRAVENOUS

## 2019-10-13 MED ORDER — SODIUM CHLORIDE 0.9 % IV SOLN
2.0000 g | Freq: Once | INTRAVENOUS | Status: AC
  Administered 2019-10-13: 2 g via INTRAVENOUS
  Filled 2019-10-13: qty 2

## 2019-10-13 MED ORDER — LIDOCAINE HCL (CARDIAC) PF 100 MG/5ML IV SOSY
PREFILLED_SYRINGE | INTRAVENOUS | Status: DC | PRN
  Administered 2019-10-13: 100 mg via INTRAVENOUS

## 2019-10-13 MED ORDER — IPRATROPIUM-ALBUTEROL 0.5-2.5 (3) MG/3ML IN SOLN: 3.0000 mL | RESPIRATORY_TRACT | Status: DC | PRN

## 2019-10-13 MED ORDER — FENTANYL 2500MCG IN NS 250ML (10MCG/ML) PREMIX INFUSION
INTRAVENOUS | Status: AC
  Administered 2019-10-13: 50 ug/h via INTRAVENOUS
  Filled 2019-10-13: qty 250

## 2019-10-13 MED ORDER — PIPERACILLIN-TAZOBACTAM 3.375 G IVPB
3.3750 g | Freq: Three times a day (TID) | INTRAVENOUS | Status: DC
  Administered 2019-10-14 – 2019-10-20 (×20): 3.375 g via INTRAVENOUS
  Filled 2019-10-13 (×19): qty 50

## 2019-10-13 MED ORDER — IOHEXOL 300 MG/ML  SOLN
100.0000 mL | Freq: Once | INTRAMUSCULAR | Status: AC | PRN
  Administered 2019-10-13: 100 mL via INTRAVENOUS

## 2019-10-13 MED ORDER — CHLORHEXIDINE GLUCONATE 0.12% ORAL RINSE (MEDLINE KIT)
15.0000 mL | Freq: Two times a day (BID) | OROMUCOSAL | Status: DC
  Administered 2019-10-13 – 2019-10-15 (×4): 15 mL via OROMUCOSAL

## 2019-10-13 MED ORDER — PHENYLEPHRINE HCL (PRESSORS) 10 MG/ML IV SOLN
INTRAVENOUS | Status: DC | PRN
  Administered 2019-10-13: 100 ug via INTRAVENOUS
  Administered 2019-10-13 (×3): 200 ug via INTRAVENOUS

## 2019-10-13 MED ORDER — SODIUM CHLORIDE 0.9 % IV SOLN
INTRAVENOUS | Status: DC | PRN
  Administered 2019-10-13: 50 ug/min via INTRAVENOUS

## 2019-10-13 MED ORDER — PROPOFOL 10 MG/ML IV BOLUS
INTRAVENOUS | Status: AC
  Filled 2019-10-13: qty 20

## 2019-10-13 MED ORDER — MIDAZOLAM HCL 2 MG/2ML IJ SOLN: 1.0000 mg | INTRAMUSCULAR | Status: DC | PRN

## 2019-10-13 MED ORDER — KETAMINE HCL 50 MG/ML IJ SOLN
INTRAMUSCULAR | Status: DC | PRN
  Administered 2019-10-13: 50 mg via INTRAMUSCULAR

## 2019-10-13 MED ORDER — ONDANSETRON HCL 4 MG/2ML IJ SOLN
4.0000 mg | Freq: Once | INTRAMUSCULAR | Status: AC
  Administered 2019-10-13: 4 mg via INTRAVENOUS
  Filled 2019-10-13: qty 2

## 2019-10-13 MED ORDER — ONDANSETRON 4 MG PO TBDP
4.0000 mg | ORAL_TABLET | Freq: Four times a day (QID) | ORAL | Status: DC | PRN
  Filled 2019-10-13: qty 1

## 2019-10-13 MED ORDER — MORPHINE SULFATE (PF) 2 MG/ML IV SOLN: 2.0000 mg | INTRAVENOUS | Status: DC | PRN

## 2019-10-13 MED ORDER — METRONIDAZOLE IN NACL 5-0.79 MG/ML-% IV SOLN
500.0000 mg | Freq: Once | INTRAVENOUS | Status: DC
  Filled 2019-10-13: qty 100

## 2019-10-13 MED ORDER — EPHEDRINE SULFATE 50 MG/ML IJ SOLN
INTRAMUSCULAR | Status: DC | PRN
  Administered 2019-10-13 (×4): 10 mg via INTRAVENOUS

## 2019-10-13 MED ORDER — FENTANYL CITRATE (PF) 100 MCG/2ML IJ SOLN: 25.0000 ug | Freq: Once | INTRAMUSCULAR | Status: DC

## 2019-10-13 MED ORDER — ONDANSETRON HCL 4 MG/2ML IJ SOLN
INTRAMUSCULAR | Status: AC
  Filled 2019-10-13: qty 2

## 2019-10-13 MED ORDER — PROPOFOL 10 MG/ML IV BOLUS
INTRAVENOUS | Status: DC | PRN
  Administered 2019-10-13: 120 mg via INTRAVENOUS

## 2019-10-13 MED ORDER — ONDANSETRON HCL 4 MG/2ML IJ SOLN: 4.0000 mg | Freq: Four times a day (QID) | INTRAMUSCULAR | Status: DC | PRN

## 2019-10-13 SURGICAL SUPPLY — 52 items
APL PRP STRL LF ISPRP CHG 10.5 (MISCELLANEOUS) ×1
APPLICATOR CHLORAPREP 10.5 ORG (MISCELLANEOUS) ×3 IMPLANT
BARRIER SKIN 2 3/4 (OSTOMY) ×2 IMPLANT
BARRIER SKIN 2 3/4 INCH (OSTOMY) ×1
BARRIER SKIN OD2.25 2 3/4 FLNG (OSTOMY) IMPLANT
BRR ADH 6X5 SEPRAFILM 1 SHT (MISCELLANEOUS)
BRR SKN FLT 2.75X2.25 2 PC (OSTOMY) ×1
BULB RESERV EVAC DRAIN JP 100C (MISCELLANEOUS) ×2 IMPLANT
CANISTER SUCT 1200ML W/VALVE (MISCELLANEOUS) ×3 IMPLANT
COVER WAND RF STERILE (DRAPES) ×3 IMPLANT
DRAIN CHANNEL JP 19F (MISCELLANEOUS) ×2 IMPLANT
DRAPE LAPAROTOMY 100X77 ABD (DRAPES) ×3 IMPLANT
DRSG OPSITE POSTOP 4X10 (GAUZE/BANDAGES/DRESSINGS) ×2 IMPLANT
DRSG OPSITE POSTOP 4X12 (GAUZE/BANDAGES/DRESSINGS) ×1 IMPLANT
DRSG OPSITE POSTOP 4X8 (GAUZE/BANDAGES/DRESSINGS) IMPLANT
DRSG TEGADERM 4X10 (GAUZE/BANDAGES/DRESSINGS) ×1 IMPLANT
DRSG TEGADERM 4X4.75 (GAUZE/BANDAGES/DRESSINGS) ×2 IMPLANT
ELECT CAUTERY BLADE 6.4 (BLADE) ×3 IMPLANT
ELECT REM PT RETURN 9FT ADLT (ELECTROSURGICAL) ×3
ELECTRODE REM PT RTRN 9FT ADLT (ELECTROSURGICAL) ×1 IMPLANT
GAUZE SPONGE 4X4 12PLY STRL (GAUZE/BANDAGES/DRESSINGS) ×3 IMPLANT
GLOVE BIO SURGEON STRL SZ 6.5 (GLOVE) ×3 IMPLANT
GLOVE BIO SURGEONS STRL SZ 6.5 (GLOVE) ×3
GLOVE SURG SYN 7.0 (GLOVE) ×6 IMPLANT
GLOVE SURG SYN 7.0 PF PI (GLOVE) ×2 IMPLANT
GLOVE SURG SYN 7.5  E (GLOVE) ×6
GLOVE SURG SYN 7.5 E (GLOVE) ×2 IMPLANT
GLOVE SURG SYN 7.5 PF PI (GLOVE) ×2 IMPLANT
GOWN STRL REUS W/ TWL LRG LVL3 (GOWN DISPOSABLE) ×4 IMPLANT
GOWN STRL REUS W/TWL LRG LVL3 (GOWN DISPOSABLE) ×12
LABEL OR SOLS (LABEL) ×3 IMPLANT
LIGASURE IMPACT 36 18CM CVD LR (INSTRUMENTS) ×3 IMPLANT
NEEDLE HYPO 22GX1.5 SAFETY (NEEDLE) ×3 IMPLANT
NS IRRIG 1000ML POUR BTL (IV SOLUTION) ×3 IMPLANT
PACK BASIN MAJOR ARMC (MISCELLANEOUS) ×3 IMPLANT
PACK COLON CLEAN CLOSURE (MISCELLANEOUS) ×3 IMPLANT
SEPRAFILM MEMBRANE 5X6 (MISCELLANEOUS) ×1 IMPLANT
STAPLER PROXIMATE 75MM BLUE (STAPLE) ×2 IMPLANT
STAPLER SKIN PROX 35W (STAPLE) ×3 IMPLANT
SUT ETHILON 3-0 FS-10 30 BLK (SUTURE) ×3
SUT PDS AB 1 CT1 36 (SUTURE) ×3 IMPLANT
SUT PROLENE 0 CT 1 30 (SUTURE) ×3 IMPLANT
SUT PROLENE 2 0 SH DA (SUTURE) ×2 IMPLANT
SUT SILK 2 0 (SUTURE) ×3
SUT SILK 2-0 18XBRD TIE 12 (SUTURE) ×1 IMPLANT
SUT SILK 3-0 (SUTURE) ×5 IMPLANT
SUT VIC AB 3-0 SH 27 (SUTURE) ×3
SUT VIC AB 3-0 SH 27X BRD (SUTURE) ×1 IMPLANT
SUT VICRYL 3-0 18IN CR (SUTURE) ×2 IMPLANT
SUTURE EHLN 3-0 FS-10 30 BLK (SUTURE) IMPLANT
SYR 10ML LL (SYRINGE) ×3 IMPLANT
TRAY FOLEY MTR SLVR 16FR STAT (SET/KITS/TRAYS/PACK) ×3 IMPLANT

## 2019-10-13 NOTE — Consult Note (Signed)
Name: Harold Barber MRN: [id number] DOB: 12-31-1930    ADMISSION DATE:  10/13/2019 CONSULTATION DATE:  10/13/2019  REFERRING MD :  Dr. Hampton Abbot  CHIEF COMPLAINT:  Post-op Ventilator Management  BRIEF PATIENT DESCRIPTION:  86 y.o. male admitted with Sepsis due to Perforated Diverticulitis requiring Exploratory Laparotomy and Hartmann's Procedure.  Remains intubated post procedure.  SIGNIFICANT EVENTS  9/21: Presented to ED with abdominal pain and AMS; found to have perforated diverticulitis 9/21: Required emergent Exp. Laparotomy and Hartmann's Procedure 9/21: Returns to ICU post procedure, remains intubated.  PCCM consulted  STUDIES:  9/21: CXR>>No edema or airspace opacity. Heart is upper normal in size with pulmonary vascularity normal. No adenopathy. There is apparent pneumoperitoneum. No pneumothorax. 9/21>> CT head>>1. No acute intracranial abnormality. 9/21: CT Abdomen & Pelvis>>1. Constellation of findings are compatible with acute perforated diverticulitis. No focal drainable fluid collection adjacent to the site of perforation. 2. There are likely 2 early abscesses/phlegmon forming posterior to the LEFT greater than RIGHT diaphragm at the crus. Recommend close attention on clinical follow-up. 3. Esophagitis. 4. Small bilateral pleural effusions.  Sagittal reformats are now available for review.  Revisualization of a compression fracture deformity of L1. This is unchanged since October 08, 2019 in terms of vertebral body height loss and retropulsion of fragments. However, it is new since September 25, 2019. There is new air within the vertebral body in comparison to prior. This is most likely due to vacuum phenomenon/nitrogen gas but given close proximity of adjacent potential developing phlegmon along the diaphragmatic crus, recommend close attention on follow-up to ensure there is no development of subsequent discitis.  CULTURES: SARS-CoV-2 PCR 9/21>>  negative Urine culture 9/21>> Blood culture x2 9/21>>  ANTIBIOTICS: Cefepime x1 dose 9/21 Flagyl x1 dose 9/21 Zosyn 9/21>>  HISTORY OF PRESENT ILLNESS:   Harold Barber is a 86 year old male with a past medical history significant for Alzheimer's dementia who presents to [facility] ED on 10/13/2019 due to altered mental status and abdominal pain.  Of note patient was recently here on 10/08/2019 for evaluation 2 days after falling at home.  He was diagnosed with an L1 compression fracture as well as bilateral kidney stones.  He had referrals with urology (Dr. Erlene Quan) and orthopedic surgery (Dr. Arvella Nigh and Dr. Rudene Christians).  He was seen by Dr. Ardeen Fillers today for discussion about possible Kyphoplasty, however while at the office he was found to be altered and dehydrated, so he was sent to the ER for further evaluation.  Patient's wife reports he is not been eating or drinking for the last couple days, and has been complaining of worsening abdominal pain.  Initial work-up in the ED revealed WBC 22.9, lactic acid 1.5,  creatinine 1.08, hemoglobin 19.4 (hemoconcentration), and negative urinalysis.  CT head was negative for any acute intracranial process.  CT scan of the abdomen and pelvis showed pneumoperitoneum with perforated diverticulitis.  General surgery was consulted and he was taken emergently to the OR for exploratory laparotomy and Hartman's procedure. Given his critical condition, he was left intubated post procedure and transferred to ICU.  PCCM is consulted for postop ventilator management, along with further management of sepsis secondary to perforated diverticulitis.  PAST MEDICAL HISTORY :   has a past medical history of Dementia ([place]) and Kidney stones.  has a past surgical history that includes Cholecystectomy; Tonsillectomy; and Appendectomy. Prior to Admission medications   Medication Sig Start Date End Date Taking? Authorizing Provider  donepezil (ARICEPT) 10 MG tablet Take 1 tablet  by mouth  daily. 08/31/19 08/30/20 Yes [provider]  memantine (NAMENDA) 5 MG tablet Take 5 mg by mouth 2 (two) times daily.   Yes [provider]  Multiple Vitamin (MULTIVITAMIN WITH MINERALS) TABS tablet Take 1 tablet by mouth daily.   Yes [provider]  sulfamethoxazole-trimethoprim (BACTRIM DS) 800-160 MG tablet Take 1 tablet by mouth in the morning and at bedtime. 09/30/19  Yes [provider]  traMADol (ULTRAM) 50 MG tablet Take 1 tablet (50 mg total) by mouth every 6 (six) hours as needed. 10/08/19  Yes Harvest Dark, MD  vitamin B-12 (CYANOCOBALAMIN) 250 MCG tablet Take 250 mcg by mouth daily.   Yes [provider]   Allergies  Allergen Reactions  . Statins     Other reaction(s): Other (See Comments) Medically inappropriate. Advanced dementia    FAMILY HISTORY:  family history is not on file. SOCIAL HISTORY:  reports that he has never smoked. He has never used smokeless tobacco. He reports that he does not drink alcohol and does not use drugs.   COVID-19 DISASTER DECLARATION:  FULL CONTACT PHYSICAL EXAMINATION WAS NOT POSSIBLE DUE TO TREATMENT OF COVID-19 AND  CONSERVATION OF PERSONAL PROTECTIVE EQUIPMENT, LIMITED EXAM FINDINGS INCLUDE-  Patient assessed or the symptoms described in the history of present illness.  In the context of the Global COVID-19 pandemic, which necessitated consideration that the patient might be at risk for infection with the SARS-CoV-2 virus that causes COVID-19, Institutional protocols and algorithms that pertain to the evaluation of patients at risk for COVID-19 are in a state of rapid change based on information released by regulatory bodies including the CDC and federal and state organizations. These policies and algorithms were followed during the patient's care while in hospital.  REVIEW OF SYSTEMS:   Unable to assess due to intubation and sedation  SUBJECTIVE:  Unable to assess due to intubation and  sedation  VITAL SIGNS: Temp:  [98 F (36.7 C)] 98 F (36.7 C) (09/21 1106) Pulse Rate:  [110-117] 110 (09/21 1315) Resp:  [24] 24 (09/21 1106) BP: (120-132)/(91-97) 120/91 (09/21 1315) SpO2:  [92 %-100 %] 100 % (09/21 2132) FiO2 (%):  [40 %] 40 % (09/21 2132) Weight:  [72.5 kg] 72.5 kg (09/21 1107)  PHYSICAL EXAMINATION: General: Acutely ill-appearing male, laying in bed, intubated and sedated, no acute distress Neuro: Heavily sedated arriving from PACU, pupils PERRLA HEENT: Atraumatic, normocephalic, neck supple, no JVD, ET tube in place Cardiovascular: Tachycardia, regular rhythm, S1-S2, no murmurs, rubs, gallops, 1+ distal pulses Lungs: Clear to auscultation bilaterally, even, vent assisted Abdomen: Midline abdominal incision with honeycomb dressing clean dry and intact, end colostomy, Blake drain to right lower quadrant Musculoskeletal: Normal bulk and tone, no deformities, no edema Skin: Warm and dry.  No obvious rashes, lesions, ulcerations  Recent Labs  Lab 10/08/19 0917 10/13/19 1105  NA 135 142  K 4.8 4.3  CL 100 106  CO2 19* 21*  BUN 21 45*  CREATININE 1.38* 1.08  GLUCOSE 225* 175*   Recent Labs  Lab 10/08/19 0917 10/13/19 1105  HGB 18.4* 19.4*  HCT 55.5* 56.5*  WBC 15.9* 22.9*  PLT 248 285   CT Head Wo Contrast  Result Date: 10/13/2019 CLINICAL DATA:  Altered mental status elevated white blood cell EXAM: CT HEAD WITHOUT CONTRAST TECHNIQUE: Contiguous axial images were obtained from the base of the skull through the vertex without intravenous contrast. COMPARISON:  November 20, 2014. FINDINGS: Brain: No evidence of acute infarction, hemorrhage, hydrocephalus,  extra-axial collection or mass lesion/mass effect. Periventricular white matter hypodensities consistent with sequela of chronic microvascular ischemic disease. Global parenchymal volume loss. Vascular: Vascular calcifications. Skull: Normal. Negative for fracture or focal lesion. Sinuses/Orbits: No acute  finding. Other: None. IMPRESSION: 1. No acute intracranial abnormality. Electronically Signed   By: Valentino Saxon MD   On: 10/13/2019 14:01   CT ABDOMEN PELVIS W CONTRAST  Addendum Date: 10/13/2019   ADDENDUM REPORT: 10/13/2019 14:52 ADDENDUM: Sagittal reformats are now available for review. Revisualization of a compression fracture deformity of L1. This is unchanged since October 08, 2019 in terms of vertebral body height loss and retropulsion of fragments. However, it is new since September 25, 2019. There is new air within the vertebral body in comparison to prior. This is most likely due to vacuum phenomenon/nitrogen gas but given close proximity of adjacent potential developing phlegmon along the diaphragmatic crus, recommend close attention on follow-up to ensure there is no development of subsequent discitis. Electronically Signed   By: Valentino Saxon MD   On: 10/13/2019 14:52   Result Date: 10/13/2019 CLINICAL DATA:  Leukocytosis EXAM: CT ABDOMEN AND PELVIS WITH CONTRAST TECHNIQUE: Multidetector CT imaging of the abdomen and pelvis was performed using the standard protocol following bolus administration of intravenous contrast. CONTRAST:  153m OMNIPAQUE IOHEXOL 300 MG/ML  SOLN COMPARISON:  09/25/2019, 10/08/2019. FINDINGS: Lower chest: Small bilateral pleural effusions. Bibasilar atelectasis. Hepatobiliary: Indeterminate hypodense lesion of the inferior RIGHT liver measures 16 mm (series 6, image 25). Status post cholecystectomy. Pancreas: Unremarkable. No pancreatic ductal dilatation or surrounding inflammatory changes. Spleen: Normal in size without focal abnormality. Adrenals/Urinary Tract: Adrenals are unremarkable. There is revisualization of a 9 mm nephrolithiasis in the LEFT renal pelvis. No adjacent obstruction. There is an additional 5 mm nephrolithiasis in the RIGHT renal pelvis without upstream obstruction. This is unchanged in comparison to prior. Kidneys enhance symmetrically.  Subcentimeter hypodense lesions are too small to accurately characterize. Bladder is decompressed. Stomach/Bowel: There is free air. In the LEFT lower quadrant, there are multiple diverticula with a thickened appearance of the sigmoid and descending colon and mild adjacent fat stranding. No pneumatosis or portal venous gas. No focal drainable fluid collection adjacent to the site. Mild mucosal enhancement and circumferential wall thickening of the distal esophagus likely reflecting esophagitis. Duodenal diverticula. Vascular/Lymphatic: Atherosclerotic calcifications of the aorta. Retroaortic LEFT renal vein. No suspicious lymph nodes. Reproductive: Prostate is present.  TURP defect. Other: There is a tiny rim enhancing air and fluid collection along the LEFT diaphragm which measures 2.1 cm by 0.6 cm (series 2, image 26); this is new since prior and may reflect early abscess formation. There is an additional small focus of air with possible trace adjacent fluid along the RIGHT posterior crus (series 2, image 27 Musculoskeletal: Unchanged compression fracture of L1. IMPRESSION: 1. Constellation of findings are compatible with acute perforated diverticulitis. No focal drainable fluid collection adjacent to the site of perforation. 2. There are likely 2 early abscesses/phlegmon forming posterior to the LEFT greater than RIGHT diaphragm at the crus. Recommend close attention on clinical follow-up. 3. Esophagitis. 4. Small bilateral pleural effusions. These results were called by telephone at the time of interpretation on 10/13/2019 at 2:16 pm to provider PMerlyn Lot, who verbally acknowledged these results. Electronically Signed: By: SValentino SaxonMD On: 10/13/2019 14:23   DG Chest Port 1 View  Result Date: 10/13/2019 CLINICAL DATA:  Intubated, leukocytosis EXAM: PORTABLE CHEST 1 VIEW COMPARISON:  10/13/2019 FINDINGS: Single frontal view of  the chest demonstrates endotracheal tube overlying tracheal air  column, tip at level of thoracic inlet. Enteric catheter passes below diaphragm, tip overlying gastric body. Cardiac silhouette is unremarkable. Hypoventilatory changes are seen at the lung bases. Pneumoperitoneum is again identified, consistent with the perforated diverticulitis seen on recent abdominal CT. IMPRESSION: 1. No complications after intubation. 2. Persistent pneumoperitoneum consistent with perforated diverticulitis seen on recent CT. 3. Hypoventilatory changes at the lung bases. Electronically Signed   By: Randa Ngo M.D.   On: 10/13/2019 21:55   DG Chest Portable 1 View  Result Date: 10/13/2019 CLINICAL DATA:  Shortness of breath. EXAM: PORTABLE CHEST 1 VIEW COMPARISON:  None. FINDINGS: No edema or airspace opacity. Heart is upper normal in size with pulmonary vascularity normal. No adenopathy. There is apparent pneumoperitoneum. No pneumothorax. IMPRESSION: Apparent pneumoperitoneum. This finding may warrant CT abdomen and pelvis to further evaluate. No edema or consolidation.  Heart upper normal in size. Critical Value/emergent results were called by telephone at the time of interpretation on 10/13/2019 at 12:46 pm to provider Merlyn Lot , who verbally acknowledged these results. Electronically Signed   By: Lowella Grip III M.D.   On: 10/13/2019 12:46    ASSESSMENT / PLAN:  Post-op Ventilator Management -Full vent support -Wean FiO2 and PEEP as tolerated to maintain O2 sats greater than 92% -Follow intermittent chest x-ray and ABG as needed -VAP protocol -Spontaneous breathing trials when respiratory parameters met -As needed bronchodilators   Sepsis secondary to Perforated Diverticulitis -General surgery following, appreciate input, follow-up recommendations -Status post exploratory laparotomy and Hartman's procedure on 10/13/19 -Monitor fever curve -Trend WBCs and procalcitonin -Follow cultures as above -Continue Zosyn -Trend lactic acid   Sedation needs in  setting of mechanical ventilation Acute Metabolic Encephalopathy in setting of sepsis Hx: Alzheimer's Dementia -Maintain RASS 0 to -1 -Fentanyl gtt and prn versed pushes to maintain RASS goal -Avoid sedating meds as able  -Daily wake up assessment -CT Head 9/21 negative for any acute intracranial process -Provide supportive care   Hyperglycemia -CBG's -SSI -Follow ICU Hypo/Hyperglycemia protocol         [facility] DISPOSITION: ICU GOALS OF CARE: FULL CODE VTE PROPHYLAXIS: SCD'S (CAN START CHEMICAL PROPHYLAXIS WHEN CLEARED BY SURGERY) SUP: IV PROTONIX CONSULTS: GENERAL SURGERY (PRIMARY SERVICE) UPDATES: UPDATED PT'S DAUGHTER AND WIFE AT BEDSIDE 10/13/19   Darel Hong, AGACNP-BC [facility] Pager: [phone]  10/13/2019, 10:03 PM

## 2019-10-13 NOTE — H&P (Signed)
Date of Admission:  10/13/2019  Reason for Admission:  Perforated Diverticulitis  History of Present Illness: Harold Barber is a 86 y.o. male presenting from home with altered mental status and abdominal pain.  His wife is at bedside.  He has a history of Alzheimer's dementia, and his wife is the historian.  She reports they were recently here on 9/16, two days after he fell at home.  He was diagnosed with an L1 compression fracture as well as bilateral kidney stones.  He had referrals with Urology (Dr. Apolinar Junes), and Orthopaedic Surgery (Mr. Floyce Stakes / Dr. Rosita Kea).  He was seen by Mr. Floyce Stakes today and there was discussion about possible kyphoplasty.  However, the patient was very altered and dehydrated and was sent to the ER for further evaluation.  The patient's wife reports that he's not been eating for the last couple of days and she's almost had to force him to drink fluids.  He has not had a bowel movement since almost a week, and has been complaining of worsening abdominal pain.  His mental status has also deteriorated and he's not been able to communicate well.  With his L1 fracture, he has also not been walking at home and has been bedbound.  In the ED today, he had labs showing a WBC of 22.9, Cr of 1.08, Hgb 19.4 (hemoconcentration), negative U/A except for moderate Hgb.  He had a head CT which was negative for acute pathology.  He had a CT scan of abdomen and pelvis which showed pneumoperitoneum with perforated diverticulitis.    Prior to his fall, the patient's wife reports that he was overall healthy, with progressing Alzheimer's over the past 5 years, but he walks around the house on his own, they go to church every Sunday morning, but otherwise the activity level is more sedentary.  Denies any cardiac or pulmonary disease.  He does have history of diverticulitis in the past and per his wife and daughters, has had issues with this for many years.  Past Medical History: Past Medical History:   Diagnosis Date  . Dementia (HCC)   . Kidney stones      Past Surgical History: Past Surgical History:  Procedure Laterality Date  . APPENDECTOMY    . CHOLECYSTECTOMY    . TONSILLECTOMY      Home Medications: Prior to Admission medications   Medication Sig Start Date End Date Taking? Authorizing Provider  donepezil (ARICEPT) 10 MG tablet Take 1 tablet by mouth daily. 08/31/19 08/30/20 Yes [provider]  memantine (NAMENDA) 5 MG tablet Take 5 mg by mouth 2 (two) times daily.   Yes [provider]  Multiple Vitamin (MULTIVITAMIN WITH MINERALS) TABS tablet Take 1 tablet by mouth daily.   Yes [provider]  sulfamethoxazole-trimethoprim (BACTRIM DS) 800-160 MG tablet Take 1 tablet by mouth in the morning and at bedtime. 09/30/19  Yes [provider]  traMADol (ULTRAM) 50 MG tablet Take 1 tablet (50 mg total) by mouth every 6 (six) hours as needed. 10/08/19  Yes Minna Antis, MD  vitamin B-12 (CYANOCOBALAMIN) 250 MCG tablet Take 250 mcg by mouth daily.   Yes [provider]    Allergies: Allergies  Allergen Reactions  . Statins     Other reaction(s): Other (See Comments) Medically inappropriate. Advanced dementia    Social History:  reports that he has never smoked. He has never used smokeless tobacco. He reports that he does not drink alcohol and does not use drugs.  Family History: No family history on file.  Review of Systems: Review of Systems  Unable to perform ROS: Dementia    Physical Exam BP (!) 120/91   Pulse (!) 110   Temp 98 F (36.7 C) (Oral)   Resp (!) 24   Ht 5\' 5"  (1.651 m)   Wt 72.5 kg   SpO2 95%   BMI 26.60 kg/m  CONSTITUTIONAL: In some pain distress, non-verbal. HEENT:  Normocephalic, atraumatic NECK: Trachea is midline RESPIRATORY:  Shallow breathing, open mouth, has nasal canula in place, O2 sat 94% on 2L. CARDIOVASCULAR: Tachycardia with occasional PVCs on monitor. GI: The abdomen is soft,  distended, with tenderness to palpation, peritoneal.  MUSCULOSKELETAL:  No peripheral edema or cyanosis. SKIN: Skin turgor is normal. There are no pathologic skin lesions.  NEUROLOGIC:  Unable to assess. PSYCH:  Unable to assess.  Laboratory Analysis: Results for orders placed or performed during the hospital encounter of 10/13/19 (from the past 24 hour(s))  Basic metabolic panel     Status: Abnormal   Collection Time: 10/13/19 11:05 AM  Result Value Ref Range   Sodium 142 135 - 145 mmol/L   Potassium 4.3 3.5 - 5.1 mmol/L   Chloride 106 98 - 111 mmol/L   CO2 21 (L) 22 - 32 mmol/L   Glucose, Bld 175 (H) 70 - 99 mg/dL   BUN 45 (H) 8 - 23 mg/dL   Creatinine, Ser 1.611.08 0.61 - 1.24 mg/dL   Calcium 9.5 8.9 - 09.610.3 mg/dL   GFR calc non Af Amer >60 >60 mL/min   GFR calc Af Amer >60 >60 mL/min   Anion gap 15 5 - 15  CBC     Status: Abnormal   Collection Time: 10/13/19 11:05 AM  Result Value Ref Range   WBC 22.9 (H) 4.0 - 10.5 K/uL   RBC 6.22 (H) 4.22 - 5.81 MIL/uL   Hemoglobin 19.4 (H) 13.0 - 17.0 g/dL   HCT 04.556.5 (H) 39 - 52 %   MCV 90.8 80.0 - 100.0 fL   MCH 31.2 26.0 - 34.0 pg   MCHC 34.3 30.0 - 36.0 g/dL   RDW 40.913.7 81.111.5 - 91.415.5 %   Platelets 285 150 - 400 K/uL   nRBC 0.0 0.0 - 0.2 %  Urinalysis, Complete w Microscopic Urine, Catheterized     Status: Abnormal   Collection Time: 10/13/19  1:28 PM  Result Value Ref Range   Color, Urine YELLOW (A) YELLOW   APPearance HAZY (A) CLEAR   Specific Gravity, Urine 1.020 1.005 - 1.030   pH 5.0 5.0 - 8.0   Glucose, UA NEGATIVE NEGATIVE mg/dL   Hgb urine dipstick MODERATE (A) NEGATIVE   Bilirubin Urine NEGATIVE NEGATIVE   Ketones, ur 5 (A) NEGATIVE mg/dL   Protein, ur NEGATIVE NEGATIVE mg/dL   Nitrite NEGATIVE NEGATIVE   Leukocytes,Ua NEGATIVE NEGATIVE   RBC / HPF 21-50 0 - 5 RBC/hpf   WBC, UA 0-5 0 - 5 WBC/hpf   Bacteria, UA NONE SEEN NONE SEEN   Squamous Epithelial / LPF NONE SEEN 0 - 5   Mucus PRESENT     Imaging: CT Head Wo  Contrast  Result Date: 10/13/2019 CLINICAL DATA:  Altered mental status elevated white blood cell EXAM: CT HEAD WITHOUT CONTRAST TECHNIQUE: Contiguous axial images were obtained from the base of the skull through the vertex without intravenous contrast. COMPARISON:  November 20, 2014. FINDINGS: Brain: No evidence of acute infarction, hemorrhage, hydrocephalus, extra-axial collection or mass lesion/mass effect. Periventricular  white matter hypodensities consistent with sequela of chronic microvascular ischemic disease. Global parenchymal volume loss. Vascular: Vascular calcifications. Skull: Normal. Negative for fracture or focal lesion. Sinuses/Orbits: No acute finding. Other: None. IMPRESSION: 1. No acute intracranial abnormality. Electronically Signed   By: Meda Klinefelter MD   On: 10/13/2019 14:01   CT ABDOMEN PELVIS W CONTRAST  Addendum Date: 10/13/2019   ADDENDUM REPORT: 10/13/2019 14:52 ADDENDUM: Sagittal reformats are now available for review. Revisualization of a compression fracture deformity of L1. This is unchanged since October 08, 2019 in terms of vertebral body height loss and retropulsion of fragments. However, it is new since September 25, 2019. There is new air within the vertebral body in comparison to prior. This is most likely due to vacuum phenomenon/nitrogen gas but given close proximity of adjacent potential developing phlegmon along the diaphragmatic crus, recommend close attention on follow-up to ensure there is no development of subsequent discitis. Electronically Signed   By: Meda Klinefelter MD   On: 10/13/2019 14:52   Result Date: 10/13/2019 CLINICAL DATA:  Leukocytosis EXAM: CT ABDOMEN AND PELVIS WITH CONTRAST TECHNIQUE: Multidetector CT imaging of the abdomen and pelvis was performed using the standard protocol following bolus administration of intravenous contrast. CONTRAST:  OMNIPAQUE IOHEXOL 300 MG/ML  SOLN COMPARISON:  09/25/2019, 10/08/2019. FINDINGS: Lower  chest: Small bilateral pleural effusions. Bibasilar atelectasis. Hepatobiliary: Indeterminate hypodense lesion of the inferior RIGHT liver measures 16 mm (series 6, image 25). Status post cholecystectomy. Pancreas: Unremarkable. No pancreatic ductal dilatation or surrounding inflammatory changes. Spleen: Normal in size without focal abnormality. Adrenals/Urinary Tract: Adrenals are unremarkable. There is revisualization of a 9 mm nephrolithiasis in the LEFT renal pelvis. No adjacent obstruction. There is an additional 5 mm nephrolithiasis in the RIGHT renal pelvis without upstream obstruction. This is unchanged in comparison to prior. Kidneys enhance symmetrically. Subcentimeter hypodense lesions are too small to accurately characterize. Bladder is decompressed. Stomach/Bowel: There is free air. In the LEFT lower quadrant, there are multiple diverticula with a thickened appearance of the sigmoid and descending colon and mild adjacent fat stranding. No pneumatosis or portal venous gas. No focal drainable fluid collection adjacent to the site. Mild mucosal enhancement and circumferential wall thickening of the distal esophagus likely reflecting esophagitis. Duodenal diverticula. Vascular/Lymphatic: Atherosclerotic calcifications of the aorta. Retroaortic LEFT renal vein. No suspicious lymph nodes. Reproductive: Prostate is present.  TURP defect. Other: There is a tiny rim enhancing air and fluid collection along the LEFT diaphragm which measures 2.1 cm by 0.6 cm (series 2, image 26); this is new since prior and may reflect early abscess formation. There is an additional small focus of air with possible trace adjacent fluid along the RIGHT posterior crus (series 2, image 27 Musculoskeletal: Unchanged compression fracture of L1. IMPRESSION: 1. Constellation of findings are compatible with acute perforated diverticulitis. No focal drainable fluid collection adjacent to the site of perforation. 2. There are likely 2 early  abscesses/phlegmon forming posterior to the LEFT greater than RIGHT diaphragm at the crus. Recommend close attention on clinical follow-up. 3. Esophagitis. 4. Small bilateral pleural effusions. These results were called by telephone at the time of interpretation on 10/13/2019 at 2:16 pm to provider Willy Eddy , who verbally acknowledged these results. Electronically Signed: By: Meda Klinefelter MD On: 10/13/2019 14:23   DG Chest Portable 1 View  Result Date: 10/13/2019 CLINICAL DATA:  Shortness of breath. EXAM: PORTABLE CHEST 1 VIEW COMPARISON:  None. FINDINGS: No edema or airspace opacity. Heart is upper normal in  size with pulmonary vascularity normal. No adenopathy. There is apparent pneumoperitoneum. No pneumothorax. IMPRESSION: Apparent pneumoperitoneum. This finding may warrant CT abdomen and pelvis to further evaluate. No edema or consolidation.  Heart upper normal in size. Critical Value/emergent results were called by telephone at the time of interpretation on 10/13/2019 at 12:46 pm to provider Willy Eddy , who verbally acknowledged these results. Electronically Signed   By: Bretta Bang III M.D.   On: 10/13/2019 12:46    Assessment and Plan: This is a 86 y.o. male with perforated diverticulitis with large pneumoperitoneum.  --Had a lengthy discussion with the patient's wife Dewayne Hatch, his daughter Darl Pikes, and his daughter Lynden Ang on the phone.  Discussed with them that this is a very critical situation.  Reviewed the treatment plan for this type of scenario being exploratory laparotomy, bowel resection and ostomy, but in this patient's case, this is truly an uphill battle.  Discussed with them that he would likely remain intubated, with foley catheter, NG tube, ostomy, abdominal drains, in the ICU, and it is unclear if he can recover or what meaningful recovery he may have.  Discussed with them the options ranging from comfort measures, to conservative treatment with IV antibiotics, NPO  diet, IV fluids, to full on treatment with surgery.  At this point, they want to proceed with full measures and proceed with surgery.  They feel this is the one route that may have some hope for recovery.   --He has been posted to the OR schedule for exploratory laparotomy, bowel resection, and end colostomy.  Risks of bleeding, infection, and injury to surrounding structures were also discussed with the family and they're willing to proceed.  COVID test is pending and he's been started on IV antibiotics.   Howie Ill, MD [facility]:  [phone]

## 2019-10-13 NOTE — ED Triage Notes (Signed)
Brought by ems from [facility] ortho. Diagnosed with L1 fracture, dehydration and increased confusion and pain.  He is on a stretcher.  He opens eyes to verbal.  I cant understand his answers.  His wife is in the room.  He lives at home.he has been able to ambulate since the fall.  He appears short of breath.

## 2019-10-13 NOTE — Op Note (Signed)
Procedure Date:  10/13/2019  Pre-operative Diagnosis:   Pneumoperitoneum  Post-operative Diagnosis:  Perforated diverticulitis  Procedure:  Exploratory Laparotomy, Hartmann's procedure  Surgeon:  Howie Ill, MD  Assistant:  Lynden Oxford, PA-C; Sena Hitch, PA-S  Anesthesia:  General endotracheal  Estimated Blood Loss:  75 ml  Specimens:  Sigmoid colon  Complications:  None  Indications for Procedure:  This is a 86 y.o. male who presents with abdominal pain and peritonitis and workup revealing pneumoperitoneum.  The risks of bleeding, abscess or infection, injury to surrounding structures, and need for further procedures were all discussed with the patient and was willing to proceed.  Description of Procedure: The patient was correctly identified in the preoperative area and brought into the operating room.  The patient was placed supine with VTE prophylaxis in place.  Appropriate time-outs were performed.  Anesthesia was induced and the patient was intubated.  Foley catheter was placed.  Appropriate antibiotics were infused.  The abdomen was prepped and draped in a sterile fashion.  A midline incision was made and electrocautery was used to dissect down the subcutaneous tissue to the fascia.  The fascia was incised and extended superiorly and inferiorly.  The abdomen was explored revealing no purulent or ascitic fluid.  The small bowel was run completely from the Ligament of Treitz to the ileocecal valve and it had no injury.  There was one small proximal jejunal diverticulum.  There was some adhesion of the terminal ileum to the abdominal wall from his prior open appendectomy.  On evaluation of the sigmoid colon, the sigmoid colon was inflamed and hardened.  There was no frank perforation noted, but one area of thick fibrinous material, likely had sealed the perforation.  We began dissecting along the White line of Toldt proximally and distally.  The left ureter was identified  and preserved.  We found a section of the rectosigmoid junction without inflammation and a window was created in the mesentery.  A Contour 60 green load stapler was used to transect the colon at that point.  Then LigaSure was used to transect across the mesentery of the sigmoid colon proximally, careful not to injure the ureter.  Once we were proximal to the inflamed, hardened sigmoid, a window in the mesentery was created and a GIA blue load stapler was used to transect proximally.  The specimen was sent to pathology.  The abdomen was then thoroughly irrigated with warm saline.  A 19 Fr. Blake drain was inserted via the RLQ into the pelvis and left lower quadrant.  Cautery was then used to create a site on the skin for the end colostomy.  This was dissected down to the fascia, and through the fascia using a cruciate incision.  Two fingers were able to pass through the opening, and the end of the colon was passed through using a Babcock.  Epiploic fat was resected to allow for easier passage through the opening without tightness.    We then proceeded with our clean closure.  We rescrubbed and used new gown and gloves.  60 ml of Exparel solution combined with 0.25% bupivacaine with epi was infiltrated onto the peritoneum, fascia, and subcutaneous tissue.  The midline incision was closed with #1 PDS sutures x 2.  The wound was irrigated and closed using staples.  The drain was secured to skin using 3-0 Nylon suture.  We then proceeded to mature the end colostomy in standard fashion.  It was flush with the skin without any ischemia.  The field was cleaned, and the midline incision was dressed with Honeycomb dressing, the ostomy with appropriate ostomy appliance, and the drain with 4x4 gauze and TegaDerm.  Given the critical condition of the patient, he was left intubated and sedated, and was then brought to the ICU for further management.  The patient tolerated the procedure well and all counts were correct at  the end of the case.   Howie Ill, MD

## 2019-10-13 NOTE — Transfer of Care (Signed)
Immediate Anesthesia Transfer of Care Note  Patient: Harold Barber  Procedure(s) Performed: EXPLORATORY LAPAROTOMY (N/A ) SIGMOID COLECTOMY WITH COLOSTOMY CREATION/HARTMANN PROCEDURE (N/A )  Patient Location: PACU and ICU  Anesthesia Type:General  Level of Consciousness: Patient remains intubated per anesthesia plan  Airway & Oxygen Therapy: Patient remains intubated per anesthesia plan  Post-op Assessment: Post -op Vital signs reviewed and stable  Post vital signs: Reviewed and stable  Last Vitals:  Vitals Value Taken Time  BP    Temp    Pulse    Resp 18 10/13/19 2124  SpO2    Vitals shown include unvalidated device data.  Last Pain:  Vitals:   10/13/19 1106  TempSrc: Oral         Complications: No complications documented.

## 2019-10-13 NOTE — Anesthesia Procedure Notes (Signed)
Procedure Name: Intubation Date/Time: 10/13/2019 5:24 PM Performed by: Rosanne Gutting, CRNA Pre-anesthesia Checklist: Patient identified, Patient being monitored, Timeout performed, Emergency Drugs available and Suction available Patient Re-evaluated:Patient Re-evaluated prior to induction Oxygen Delivery Method: Circle system utilized Preoxygenation: Pre-oxygenation with 100% oxygen Induction Type: IV induction Ventilation: Mask ventilation without difficulty and Oral airway inserted - appropriate to patient size Laryngoscope Size: McGraph and 4 Grade View: Grade I Tube type: Oral Tube size: 7.5 mm Number of attempts: 1 Airway Equipment and Method: Stylet Placement Confirmation: ETT inserted through vocal cords under direct vision,  positive ETCO2 and breath sounds checked- equal and bilateral Secured at: 23 cm Tube secured with: Tape Dental Injury: Teeth and Oropharynx as per pre-operative assessment

## 2019-10-13 NOTE — Anesthesia Preprocedure Evaluation (Signed)
Anesthesia Evaluation  Patient identified by MRN, date of birth, ID band Patient confused    Reviewed: Patient's Chart, lab work & pertinent test results  History of Anesthesia Complications Negative for: history of anesthetic complications  Airway Mallampati: III       Dental   Pulmonary neg sleep apnea, neg COPD, Not current smoker,           Cardiovascular (-) hypertension(-) Past MI and (-) CHF (-) dysrhythmias (-) Valvular Problems/Murmurs     Neuro/Psych neg Seizures Dementia    GI/Hepatic Neg liver ROS, neg GERD  ,  Endo/Other  neg diabetes  Renal/GU Renal disease (stones)     Musculoskeletal   Abdominal   Peds  Hematology   Anesthesia Other Findings   Reproductive/Obstetrics                             Anesthesia Physical Anesthesia Plan  ASA: III and emergent  Anesthesia Plan: General   Post-op Pain Management:    Induction: Intravenous  PONV Risk Score and Plan: 2 and Ondansetron and Dexamethasone  Airway Management Planned: Oral ETT  Additional Equipment:   Intra-op Plan:   Post-operative Plan: Possible Post-op intubation/ventilation  Informed Consent: I have reviewed the patients History and Physical, chart, labs and discussed the procedure including the risks, benefits and alternatives for the proposed anesthesia with the patient or authorized representative who has indicated his/her understanding and acceptance.       Plan Discussed with:   Anesthesia Plan Comments:         Anesthesia Quick Evaluation

## 2019-10-13 NOTE — Progress Notes (Signed)
PHARMACY -  BRIEF ANTIBIOTIC NOTE   Pharmacy has received consult(s) for Cefepime from an ED provider.  The patient's profile has been reviewed for ht/wt/allergies/indication/available labs.    One time order(s) placed by MD for Cefepime 2 gm   Further antibiotics/pharmacy consults should be ordered by admitting physician if indicated.                       Thank you, Sacred Roa A 10/13/2019  2:30 PM

## 2019-10-13 NOTE — ED Provider Notes (Signed)
[facility] Emergency Department Provider Note    First MD Initiated Contact with Patient 10/13/19 1229     (approximate)  I have reviewed the triage vital signs and the nursing notes.   HISTORY  Chief Complaint Fall and Altered Mental Status  Level V Caveat:  AMS  HPI Harold Barber is a 86 y.o. male   below listed past medical history presents to the ER for 3 days of decreased p.o. intake and increasing altered mental status.  Patient on my exam is complaining of abdominal pain but unable to provide any additional history 2/2 ams and dementia   Past Medical History:  Diagnosis Date  . Dementia (HCC)   . Kidney stones    No family history on file. Past Surgical History:  Procedure Laterality Date  . APPENDECTOMY    . CHOLECYSTECTOMY    . TONSILLECTOMY     There are no problems to display for this patient.     Prior to Admission medications   Medication Sig Start Date End Date Taking? Authorizing Provider  donepezil (ARICEPT) 10 MG tablet Take 1 tablet by mouth daily. 08/31/19 08/30/20 Yes [provider]  memantine (NAMENDA) 5 MG tablet Take 5 mg by mouth 2 (two) times daily.   Yes [provider]  traMADol (ULTRAM) 50 MG tablet Take 1 tablet (50 mg total) by mouth every 6 (six) hours as needed. 10/08/19  Yes Minna Antis, MD  sulfamethoxazole-trimethoprim (BACTRIM DS) 800-160 MG tablet Take 1 tablet by mouth in the morning and at bedtime. 09/30/19   [provider]  tamsulosin (FLOMAX) 0.4 MG CAPS capsule Take 0.4 mg by mouth daily. 09/30/19   [provider]    Allergies Statins    Social History Social History   Tobacco Use  . Smoking status: Never Smoker  . Smokeless tobacco: Never Used  Substance Use Topics  . Alcohol use: Never  . Drug use: Never    Review of Systems Patient denies headaches, rhinorrhea, blurry vision, numbness, shortness of breath, chest pain, edema, cough, abdominal pain,  nausea, vomiting, diarrhea, dysuria, fevers, rashes or hallucinations unless otherwise stated above in HPI. ____________________________________________   PHYSICAL EXAM:  VITAL SIGNS: Vitals:   10/13/19 1106 10/13/19 1315  BP: (!) 132/97 (!) 120/91  Pulse: (!) 117 (!) 110  Resp: (!) 24   Temp: 98 F (36.7 C)   SpO2: 92% 95%    Constitutional: Alert, ill appearing, unable to provide additional history.  Eyes: Conjunctivae are normal.  Head: Atraumatic. Nose: No congestion/rhinnorhea. Mouth/Throat: Mucous membranes are moist.   Neck: No stridor. Painless ROM.  Cardiovascular: Normal rate, regular rhythm. Grossly normal heart sounds.  Good peripheral circulation. Respiratory: Normal respiratory effort.  No retractions. Lungs CTAB. Gastrointestinal: diffuse ttp, guarding. No distention. No abdominal bruits. No CVA tenderness. Genitourinary: deferred Musculoskeletal: No lower extremity tenderness nor edema.  No joint effusions. Neurologic:   No gross focal neurologic deficits are appreciated. No facial droop Skin:  Skin is warm, dry and intact. No rash noted. Psychiatric: unable to assess  ____________________________________________   LABS (all labs ordered are listed, but only abnormal results are displayed)  Results for orders placed or performed during the hospital encounter of 10/13/19 (from the past 24 hour(s))  Basic metabolic panel     Status: Abnormal   Collection Time: 10/13/19 11:05 AM  Result Value Ref Range   Sodium 142 135 - 145 mmol/L   Potassium 4.3 3.5 - 5.1 mmol/L  Chloride 106 98 - 111 mmol/L   CO2 21 (L) 22 - 32 mmol/L   Glucose, Bld 175 (H) 70 - 99 mg/dL   BUN 45 (H) 8 - 23 mg/dL   Creatinine, Ser 9.92 0.61 - 1.24 mg/dL   Calcium 9.5 8.9 - 42.6 mg/dL   GFR calc non Af Amer >60 >60 mL/min   GFR calc Af Amer >60 >60 mL/min   Anion gap 15 5 - 15  CBC     Status: Abnormal   Collection Time: 10/13/19 11:05 AM  Result Value Ref Range   WBC 22.9 (H)  4.0 - 10.5 K/uL   RBC 6.22 (H) 4.22 - 5.81 MIL/uL   Hemoglobin 19.4 (H) 13.0 - 17.0 g/dL   HCT 83.4 (H) 39 - 52 %   MCV 90.8 80.0 - 100.0 fL   MCH 31.2 26.0 - 34.0 pg   MCHC 34.3 30.0 - 36.0 g/dL   RDW 19.6 22.2 - 97.9 %   Platelets 285 150 - 400 K/uL   nRBC 0.0 0.0 - 0.2 %  Urinalysis, Complete w Microscopic Urine, Catheterized     Status: Abnormal   Collection Time: 10/13/19  1:28 PM  Result Value Ref Range   Color, Urine YELLOW (A) YELLOW   APPearance HAZY (A) CLEAR   Specific Gravity, Urine 1.020 1.005 - 1.030   pH 5.0 5.0 - 8.0   Glucose, UA NEGATIVE NEGATIVE mg/dL   Hgb urine dipstick MODERATE (A) NEGATIVE   Bilirubin Urine NEGATIVE NEGATIVE   Ketones, ur 5 (A) NEGATIVE mg/dL   Protein, ur NEGATIVE NEGATIVE mg/dL   Nitrite NEGATIVE NEGATIVE   Leukocytes,Ua NEGATIVE NEGATIVE   RBC / HPF 21-50 0 - 5 RBC/hpf   WBC, UA 0-5 0 - 5 WBC/hpf   Bacteria, UA NONE SEEN NONE SEEN   Squamous Epithelial / LPF NONE SEEN 0 - 5   Mucus PRESENT    ____________________________________________  EKG My review and personal interpretation at Time: 10:57   Indication: ams  Rate: 115  Rhythm: sinus Axis: normal Other: normal intervals, no stemi ____________________________________________  RADIOLOGY  I personally reviewed all radiographic images ordered to evaluate for the above acute complaints and reviewed radiology reports and findings.  These findings were personally discussed with the patient.  Please see medical record for radiology report.  ____________________________________________   PROCEDURES  Procedure(s) performed:  .Critical Care Performed by: Willy Eddy, MD Authorized by: Willy Eddy, MD   Critical care provider statement:    Critical care time (minutes):  35   Critical care time was exclusive of:  Separately billable procedures and treating other patients   Critical care was necessary to treat or prevent imminent or life-threatening deterioration of  the following conditions:  Sepsis   Critical care was time spent personally by me on the following activities:  Development of treatment plan with patient or surrogate, discussions with consultants, evaluation of patient's response to treatment, examination of patient, obtaining history from patient or surrogate, ordering and performing treatments and interventions, ordering and review of laboratory studies, ordering and review of radiographic studies, pulse oximetry, re-evaluation of patient's condition and review of old charts      Critical Care performed: yes ____________________________________________   INITIAL IMPRESSION / ASSESSMENT AND PLAN / ED COURSE  Pertinent labs & imaging results that were available during my care of the patient were reviewed by me and considered in my medical decision making (see chart for details).   DDX: Sepsis, electrolyte abnormality, contusion, fracture,  diverticulitis, colitis, mesenteric ischemia, UTI  GARWOOD WENTZELL is a 86 y.o. who presents to the ED with presentation as described above.  Patient is very ill-appearing requiring supplemental oxygen 2 L for acute respiratory failure with hypoxia suspect this secondary to altered mental status and sepsis.  Given his severe abdominal pain CT imaging ordered.  IV fluids for resuscitation ordered.  Have ordered broad-spectrum antibiotics.  Clinical Course as of Oct 12 1445  Tue Oct 13, 2019  1409 I reviewed the CT abdomen does appear to have free air. antibiotics will consult general surgery   [PR]  1433 Discussed case in consultation with Dr. Aleen Campi of general surgery who evaluated patient at bedside.   [PR]    Clinical Course User Index [PR] Willy Eddy, MD    The patient was evaluated in Emergency Department today for the symptoms described in the history of present illness. He/she was evaluated in the context of the global COVID-19 pandemic, which necessitated consideration that the patient  might be at risk for infection with the SARS-CoV-2 virus that causes COVID-19. Institutional protocols and algorithms that pertain to the evaluation of patients at risk for COVID-19 are in a state of rapid change based on information released by regulatory bodies including the CDC and federal and state organizations. These policies and algorithms were followed during the patient's care in the ED.  As part of my medical decision making, I reviewed the following data within the electronic MEDICAL RECORD NUMBER Nursing notes reviewed and incorporated, Labs reviewed, notes from prior ED visits and [place] Controlled Substance Database   ____________________________________________   FINAL CLINICAL IMPRESSION(S) / ED DIAGNOSES  Final diagnoses:  Perforated abdominal viscus  Altered mental status, unspecified altered mental status type      NEW MEDICATIONS STARTED DURING THIS VISIT:  New Prescriptions   No medications on file     Note:  This document was prepared using Dragon voice recognition software and may include unintentional dictation errors.    Willy Eddy, MD 10/13/19 [phone]

## 2019-10-13 NOTE — Anesthesia Postprocedure Evaluation (Signed)
Anesthesia Post Note  Patient: Harold Barber  Procedure(s) Performed: EXPLORATORY LAPAROTOMY (N/A ) SIGMOID COLECTOMY WITH COLOSTOMY CREATION/HARTMANN PROCEDURE (N/A )  Anesthesia Type: General Anesthetic complications: no   No complications documented.   Last Vitals:  Vitals:   10/13/19 1106 10/13/19 1315  BP: (!) 132/97 (!) 120/91  Pulse: (!) 117 (!) 110  Resp: (!) 24   Temp: 36.7 C   SpO2: 92% 95%    Last Pain:  Vitals:   10/13/19 1106  TempSrc: Oral                 Providencia Hottenstein,  [place]

## 2019-10-14 ENCOUNTER — Encounter: Payer: Self-pay | Admitting: Surgery

## 2019-10-14 LAB — CBC
HCT: 50 % (ref 39.0–52.0)
Hemoglobin: 16.2 g/dL (ref 13.0–17.0)
MCH: 31.3 pg (ref 26.0–34.0)
MCHC: 32.4 g/dL (ref 30.0–36.0)
MCV: 96.7 fL (ref 80.0–100.0)
Platelets: 217 10*3/uL (ref 150–400)
RBC: 5.17 MIL/uL (ref 4.22–5.81)
RDW: 14.1 % (ref 11.5–15.5)
WBC: 16 10*3/uL — ABNORMAL HIGH (ref 4.0–10.5)
nRBC: 0 % (ref 0.0–0.2)

## 2019-10-14 LAB — PROCALCITONIN: Procalcitonin: 0.42 ng/mL

## 2019-10-14 LAB — URINE CULTURE: Culture: NO GROWTH

## 2019-10-14 LAB — BASIC METABOLIC PANEL
Anion gap: 11 (ref 5–15)
BUN: 39 mg/dL — ABNORMAL HIGH (ref 8–23)
CO2: 18 mmol/L — ABNORMAL LOW (ref 22–32)
Calcium: 7.4 mg/dL — ABNORMAL LOW (ref 8.9–10.3)
Chloride: 115 mmol/L — ABNORMAL HIGH (ref 98–111)
Creatinine, Ser: 1.05 mg/dL (ref 0.61–1.24)
GFR calc Af Amer: >60 mL/min (ref >60)
GFR calc non Af Amer: >60 mL/min (ref >60)
Glucose, Bld: 177 mg/dL — ABNORMAL HIGH (ref 70–99)
Potassium: 5 mmol/L (ref 3.5–5.1)
Sodium: 144 mmol/L (ref 135–145)

## 2019-10-14 LAB — GLUCOSE, CAPILLARY
Glucose-Capillary: 152 mg/dL — ABNORMAL HIGH (ref 70–99)
Glucose-Capillary: 151 mg/dL — ABNORMAL HIGH (ref 70–99)
Glucose-Capillary: 144 mg/dL — ABNORMAL HIGH (ref 70–99)
Glucose-Capillary: 120 mg/dL — ABNORMAL HIGH (ref 70–99)
Glucose-Capillary: 110 mg/dL — ABNORMAL HIGH (ref 70–99)

## 2019-10-14 LAB — PHOSPHORUS: Phosphorus: 4.7 mg/dL — ABNORMAL HIGH (ref 2.5–4.6)

## 2019-10-14 LAB — HEMOGLOBIN A1C
Hgb A1c MFr Bld: 5.7 % — ABNORMAL HIGH (ref 4.8–5.6)
Mean Plasma Glucose: 116.89 mg/dL

## 2019-10-14 LAB — LACTIC ACID, PLASMA: Lactic Acid, Venous: 2.2 mmol/L (ref 0.5–1.9)

## 2019-10-14 LAB — MAGNESIUM: Magnesium: 2.5 mg/dL — ABNORMAL HIGH (ref 1.7–2.4)

## 2019-10-14 MED ORDER — MEMANTINE HCL 5 MG PO TABS
5.0000 mg | ORAL_TABLET | Freq: Two times a day (BID) | ORAL | Status: DC
  Administered 2019-10-14 – 2019-10-20 (×13): 5 mg
  Filled 2019-10-14 (×13): qty 1

## 2019-10-14 MED ORDER — DOCUSATE SODIUM 50 MG/5ML PO LIQD
100.0000 mg | Freq: Two times a day (BID) | ORAL | Status: DC
  Administered 2019-10-14 (×2): 100 mg
  Filled 2019-10-14 (×2): qty 10

## 2019-10-14 MED ORDER — ENOXAPARIN SODIUM 40 MG/0.4ML ~~LOC~~ SOLN
40.0000 mg | SUBCUTANEOUS | Status: DC
  Administered 2019-10-15 – 2019-10-20 (×6): 40 mg via SUBCUTANEOUS
  Filled 2019-10-14 (×6): qty 0.4

## 2019-10-14 MED ORDER — INSULIN ASPART 100 UNIT/ML ~~LOC~~ SOLN
0.0000 [IU] | SUBCUTANEOUS | Status: DC
  Administered 2019-10-14 (×2): 3 [IU] via SUBCUTANEOUS
  Administered 2019-10-14: 2 [IU] via SUBCUTANEOUS
  Filled 2019-10-14 (×3): qty 1

## 2019-10-14 MED ORDER — HYDROMORPHONE HCL 1 MG/ML IJ SOLN
0.5000 mg | INTRAMUSCULAR | Status: DC | PRN
  Administered 2019-10-15: 0.5 mg via INTRAVENOUS
  Filled 2019-10-14: qty 1

## 2019-10-14 MED ORDER — SODIUM CHLORIDE 0.9 % IV SOLN
INTRAVENOUS | Status: DC | PRN
  Administered 2019-10-14 – 2019-10-19 (×3): 250 mL via INTRAVENOUS

## 2019-10-14 MED ORDER — LACTATED RINGERS IV SOLN: INTRAVENOUS | Status: DC

## 2019-10-14 MED ORDER — DONEPEZIL HCL 5 MG PO TABS
10.0000 mg | ORAL_TABLET | Freq: Every day | ORAL | Status: DC
  Administered 2019-10-14 – 2019-10-20 (×7): 10 mg via NASOGASTRIC
  Filled 2019-10-14 (×7): qty 2

## 2019-10-14 MED ORDER — POLYETHYLENE GLYCOL 3350 17 G PO PACK
17.0000 g | PACK | Freq: Every day | ORAL | Status: DC
  Administered 2019-10-14: 17 g
  Filled 2019-10-14: qty 1

## 2019-10-14 NOTE — Progress Notes (Addendum)
[facility] SURGICAL PROGRESS NOTE  Hospital Day(s): 1.   Post op day(s): 1 Day Post-Op.   Interval History:  Patient seen and examined no acute events or new complaints overnight.  Patient remains intubated this morning, plan for extubation, remained off vasopressors No fevers overnight, otherwise hemodynamically stable this morning Leukocytosis improving, down to 16.0K (from 22.9K) Hgb normal at 16.2 Renal function remains normal, sCr - 1.05, UO - 835 ccs Lactic acidosis improving, down to 2.2 Slight hyperphosphatemia and hypermagnesemia, otherwise no significant electrolyte derangements NGT output only 150 ccs JP drain in RLQ with 235 ccs out, serosanguinous No recorded bowel function to this point   Vital signs in last 24 hours: [min-max] current  Temp:  [98 F (36.7 C)-99.5 F (37.5 C)] 99.4 F (37.4 C) (09/22 0742) Pulse Rate:  [91-117] 91 (09/22 0500) Resp:  [17-24] 17 (09/22 0500) BP: (85-132)/(66-97) 93/76 (09/22 0500) SpO2:  [92 %-100 %] 97 % (09/22 0500) FiO2 (%):  [40 %] 40 % (09/22 0400) Weight:  [72.5 kg] 72.5 kg (09/21 1107)     Height: 5\' 5"  (165.1 cm) Weight: 72.5 kg BMI (Calculated): 26.6   Intake/Output last 2 shifts:  09/21 0701 - 09/22 0700 In: 3766.9 [I.V.:3716.9; IV Piggyback:50.1] Out: 1520 [Urine:835; Emesis/NG output:150; Drains:235; Blood:300]   Physical Exam:  Constitutional: alert, opens eyes to stimulus, does not follow many commands Respiratory: Intubated, otherwise CTAB Cardiovascular: regular rate and sinus rhythm, few PVCs on monitor Gastrointestinal: Soft, winces with incisional palpation, non-distended, drain in RLQ with serosanguinous output, colostomy in left mid-abdomen, pink/patent, bowel seat in bag, no gas Integumentary: Laparotomy incision is CDI with staple, minimal output on honeycomb, no erythema appreciated   Labs:  CBC Latest Ref Rng & Units 10/14/2019 10/13/2019 10/08/2019  WBC 4.0 - 10.5 K/uL 16.0(H)  22.9(H) 15.9(H)  Hemoglobin 13.0 - 17.0 g/dL 10/10/2019 19.4(H) 18.4(H)  Hematocrit 39 - 52 % 50.0 56.5(H) 55.5(H)  Platelets 150 - 400 K/uL 217 285 248   CMP Latest Ref Rng & Units 10/14/2019 10/13/2019 10/08/2019  Glucose 70 - 99 mg/dL 10/10/2019) 619(J) 093(O)  BUN 8 - 23 mg/dL 671(I) 45(Y) 21  Creatinine 0.61 - 1.24 mg/dL 09(X 8.33 8.25)  Sodium 135 - 145 mmol/L 144 142 135  Potassium 3.5 - 5.1 mmol/L 5.0 4.3 4.8  Chloride 98 - 111 mmol/L 115(H) 106 100  CO2 22 - 32 mmol/L 18(L) 21(L) 19(L)  Calcium 8.9 - 10.3 mg/dL 7.4(L) 9.5 9.6  Total Protein 6.5 - 8.1 g/dL - - 8.6(H)  Total Bilirubin 0.3 - 1.2 mg/dL - - 1.5(H)  Alkaline Phos 38 - 126 U/L - - 72  AST 15 - 41 U/L - - 46(H)  ALT 0 - 44 U/L - - 31     Imaging studies: No new pertinent imaging studies   Assessment/Plan:  86 y.o. male overall doing well, improving leukocytosis, not requiring vasopressors and plan for extubation today 1 Day Post-Op s/p Hartman's Procedure for pneumoperitoneum for likely perforated diverticulitis, complicated by pertinent comorbidities including advanced age and dementia.   - Appreciate PCCM assistance; extubation planned today  - Continue IVF resuscitation  - NPO   - Continue IV ABx (Zosyn - Day 1)  - Continue NGT decompression to LIS; monitor output; I am okay with clamping tube for PO medications; would not be surprised if he develops ileus  - Monitor abdominal examination  - Monitor colostomy output; will engage WOC RN  - Maintain RLQ drain; monitor and record output  -  Maintain foley; monitor UO  All of the above findings and recommendations were discussed with the medical team, no family at bedside this morning.  -- Lynden Oxford, PA-C [facility] 10/14/2019, 7:51 AM [phone] M-F: 7am - 4pm

## 2019-10-14 NOTE — Plan of Care (Signed)
Extubated to 3 lpm nasal canula

## 2019-10-14 NOTE — Consult Note (Signed)
WOC Nurse ostomy consult note Consult received, ostomy surgery was <24 hours ago.  Surgery (Dr. Aleen Campi) has assessed this morning and end colostomy stoma is viable, non functioning. WOC Nurse will see tomorrow for first pouch change and stoma assessment. Patient remains in ICU and on vent.  It is noted that patient has Alzheimer's dementia and that he lives at home with his wife. He was seen last week in the ED (on 9/16) for a fall at home.  WOC nursing team will follow, and will remain available to this patient, the nursing, surgical and medical teams.    Thanks, Ladona Mow, MSN, RN, GNP, [facility] [place]  Pager# [phone]

## 2019-10-15 ENCOUNTER — Inpatient Hospital Stay: Payer: Medicare Other

## 2019-10-15 LAB — GLUCOSE, CAPILLARY
Glucose-Capillary: 94 mg/dL (ref 70–99)
Glucose-Capillary: 92 mg/dL (ref 70–99)
Glucose-Capillary: 85 mg/dL (ref 70–99)
Glucose-Capillary: 82 mg/dL (ref 70–99)

## 2019-10-15 LAB — BASIC METABOLIC PANEL
Anion gap: 11 (ref 5–15)
BUN: 38 mg/dL — ABNORMAL HIGH (ref 8–23)
CO2: 21 mmol/L — ABNORMAL LOW (ref 22–32)
Calcium: 8 mg/dL — ABNORMAL LOW (ref 8.9–10.3)
Chloride: 117 mmol/L — ABNORMAL HIGH (ref 98–111)
Creatinine, Ser: 1.05 mg/dL (ref 0.61–1.24)
GFR calc Af Amer: >60 mL/min (ref >60)
GFR calc non Af Amer: >60 mL/min (ref >60)
Glucose, Bld: 100 mg/dL — ABNORMAL HIGH (ref 70–99)
Potassium: 4.9 mmol/L (ref 3.5–5.1)
Sodium: 149 mmol/L — ABNORMAL HIGH (ref 135–145)

## 2019-10-15 LAB — CBC
HCT: 43.4 % (ref 39.0–52.0)
Hemoglobin: 14 g/dL (ref 13.0–17.0)
MCH: 31.1 pg (ref 26.0–34.0)
MCHC: 32.3 g/dL (ref 30.0–36.0)
MCV: 96.4 fL (ref 80.0–100.0)
Platelets: 171 10*3/uL (ref 150–400)
RBC: 4.5 MIL/uL (ref 4.22–5.81)
RDW: 13.9 % (ref 11.5–15.5)
WBC: 12 10*3/uL — ABNORMAL HIGH (ref 4.0–10.5)
nRBC: 0 % (ref 0.0–0.2)

## 2019-10-15 LAB — MAGNESIUM: Magnesium: 2.7 mg/dL — ABNORMAL HIGH (ref 1.7–2.4)

## 2019-10-15 LAB — PROCALCITONIN: Procalcitonin: 0.31 ng/mL

## 2019-10-15 LAB — SURGICAL PATHOLOGY

## 2019-10-15 LAB — PHOSPHORUS: Phosphorus: 2.8 mg/dL (ref 2.5–4.6)

## 2019-10-15 MED ORDER — ORAL CARE MOUTH RINSE
15.0000 mL | Freq: Two times a day (BID) | OROMUCOSAL | Status: DC
  Administered 2019-10-15 – 2019-10-20 (×9): 15 mL via OROMUCOSAL

## 2019-10-15 MED ORDER — CHLORHEXIDINE GLUCONATE 0.12 % MT SOLN
OROMUCOSAL | Status: AC
  Filled 2019-10-15: qty 15

## 2019-10-15 NOTE — Progress Notes (Signed)
[facility] SURGICAL PROGRESS NOTE  Hospital Day(s): 2.   Post op day(s): 2 Days Post-Op.   Interval History:  Patient seen and examined no acute events or new complaints overnight.  He was extubated yesterday afternoon to 3L [place], remains afebrile and hemodynamically stable, no need for vasopressors x24 hours Patient is much more alert and lucid, asking insightful questions regarding his condition and care Labs are pending this morning No new imaging studies Urine output recorded at 715 ccs NGT with 405 ccs out Surgical drain with 75 ccs, Serosanguinous He has not shown signs of colostomy function to this point  Vital signs in last 24 hours: [min-max] current  Temp:  [98.1 F (36.7 C)-99.4 F (37.4 C)] 98.1 F (36.7 C) (09/23 0400) Pulse Rate:  [77-95] 83 (09/23 0700) Resp:  [9-21] 9 (09/23 0700) BP: (92-132)/(42-96) 125/73 (09/23 0700) SpO2:  [91 %-97 %] 95 % (09/23 0700) FiO2 (%):  [40 %] 40 % (09/22 0816)     Height: 5\' 5"  (165.1 cm) Weight: 72.5 kg BMI (Calculated): 26.6   Intake/Output last 2 shifts:  09/22 0701 - 09/23 0700 In: 2633.7 [I.V.:2342.4; NG/GT:135; IV Piggyback:156.3] Out: 1195 [Urine:715; Emesis/NG output:405; Drains:75]   Physical Exam:  Constitutional: alert, much more lucid, asks insightful questions Respiratory: Intubated, otherwise CTAB Cardiovascular: regular rate and sinus rhythm, few PVCs on monitor Gastrointestinal: Soft, winces with incisional palpation, non-distended, drain in RLQ with serosanguinous output, colostomy in left mid-abdomen, pink/patent, bowel seat in bag, no gas Integumentary: Laparotomy incision is CDI with staple, minimal output on honeycomb, no erythema appreciated   Labs:  CBC Latest Ref Rng & Units 10/14/2019 10/13/2019 10/08/2019  WBC 4.0 - 10.5 K/uL 16.0(H) 22.9(H) 15.9(H)  Hemoglobin 13.0 - 17.0 g/dL 10/10/2019 19.4(H) 18.4(H)  Hematocrit 39 - 52 % 50.0 56.5(H) 55.5(H)  Platelets 150 - 400 K/uL 217 285 248    CMP Latest Ref Rng & Units 10/14/2019 10/13/2019 10/08/2019  Glucose 70 - 99 mg/dL 10/10/2019) 425(Z) 563(O)  BUN 8 - 23 mg/dL 756(E) 33(I) 21  Creatinine 0.61 - 1.24 mg/dL 95(J 8.84 1.66)  Sodium 135 - 145 mmol/L 144 142 135  Potassium 3.5 - 5.1 mmol/L 5.0 4.3 4.8  Chloride 98 - 111 mmol/L 115(H) 106 100  CO2 22 - 32 mmol/L 18(L) 21(L) 19(L)  Calcium 8.9 - 10.3 mg/dL 7.4(L) 9.5 9.6  Total Protein 6.5 - 8.1 g/dL - - 8.6(H)  Total Bilirubin 0.3 - 1.2 mg/dL - - 1.5(H)  Alkaline Phos 38 - 126 U/L - - 72  AST 15 - 41 U/L - - 46(H)  ALT 0 - 44 U/L - - 31     Imaging studies: No new pertinent imaging studies   Assessment/Plan:  86 y.o. male doing well 2 Days Post-Op s/p Hartman's Procedure for pneumoperitoneum for likely perforated diverticulitis, complicated by pertinent comorbidities including advanced age and dementia.   - Continue NPO + IVF Resuscitation  - Continue IV ABx (Zosyn - Day 2/14)             - Continue NGT decompression to LIS; monitor output; I am okay with clamping tube for PO medications; would not be surprised if he develops ileus   - Monitor abdominal examination             - Monitor colostomy output; WOC following             - Maintain RLQ drain; monitor and record output             -  Maintain foley; monitor UO   - Appreciate PCCM assistance; transfer to floor  All of the above findings and recommendations were discussed with the patient*, and the medical team.  -- Lynden Oxford, PA-C [facility] 10/15/2019, 7:32 AM [phone] M-F: 7am - 4pm

## 2019-10-15 NOTE — Consult Note (Addendum)
WOC Nurse ostomy consult note: POD 2 Stoma type/location: LLQ end colostomy  Stomal assessment/size: oval, red, moist, minimally budded, lumen at center. No function, no flatus.  Top to bottom measurement is 1 and 1/8 inches, side to side measurement is 1 and 1/2 inches. Peristomal assessment:  Intact in the immediate peristomal area, area of medical adhesive related skin injury (MARSI) from 2-5 o'clock measuring 7cm x 1.6cm x 0.1cm (pink, moist wound bed) Treatment options for stomal/peristomal skin: Skin barrier cut to avoid area, thin film dressing (Tegaderm) applied. Output: Small amount serosanguinous in pouch  Ostomy pouching: 2pc. 2 and 3/4 inch applied today with skin barrier ring.  I have cut away portions of the skin barrier to leave the honeycomb dressing intact (it has been in place just 36 hours) and to make room for the transparent dressing over the area of MARSI.  I have provided 4 pouching set-ups of the next smaller size, 2 and 1/4 inches to the bedside. Education provided: None today. Patient in ICU and while alert with the MD this morning, is not able to be taught at this time. He is wearing mittens to prevent dislodgement of the catheter and NGT. Enrolled patient in [employer] DC program: No  Supplies to bedside in plastic bag: (4 skin barriers (2 and 1/4 inches), 4 pouches (2 and 1/4 inches), 4 skin barrier rings, sizing guide, pattern, educational folder with Secure Start card enclosed.  Note: It has not yet been identified if patient will be discharging to home or to a skilled facility which will impact the ostomy teaching plan moving forward next week.  Next pouch change/visit will be on Monday, 9/27.  Pressure injury prevention interventions provided today:  Sacral prophylactic foam dressing, bilateral Prevalon pressure redistribution heel boots while in bed and pressure redistribution chair pad for when OOB to chair.  Turning and repositioning is in place and  patient is lying upon a mattress replacement with low air loss feature while in ICU.  WOC nursing team will follow, and will remain available to this patient, his family, the nursing, surgical and medical teams.    Thanks, Ladona Mow, MSN, RN, GNP, [facility]  Pager# [phone]

## 2019-10-15 NOTE — Progress Notes (Signed)
CSW acknowledges consult for HH/SNF/DME needs. CSW will follow-up based on PT/OT recommendations after their assessments are complete.   Alfonso Ramus, [place] [phone]

## 2019-10-15 NOTE — Progress Notes (Signed)
Patient tx to floor stable on RA, report given to [place] RN.

## 2019-10-16 ENCOUNTER — Inpatient Hospital Stay: Payer: Medicare Other

## 2019-10-16 LAB — CBC
HCT: 38.9 % — ABNORMAL LOW (ref 39.0–52.0)
Hemoglobin: 13 g/dL (ref 13.0–17.0)
MCH: 31.5 pg (ref 26.0–34.0)
MCHC: 33.4 g/dL (ref 30.0–36.0)
MCV: 94.2 fL (ref 80.0–100.0)
Platelets: 154 10*3/uL (ref 150–400)
RBC: 4.13 MIL/uL — ABNORMAL LOW (ref 4.22–5.81)
RDW: 13.6 % (ref 11.5–15.5)
WBC: 8.3 10*3/uL (ref 4.0–10.5)
nRBC: 0 % (ref 0.0–0.2)

## 2019-10-16 LAB — BASIC METABOLIC PANEL
Anion gap: 8 (ref 5–15)
BUN: 26 mg/dL — ABNORMAL HIGH (ref 8–23)
CO2: 24 mmol/L (ref 22–32)
Calcium: 7.6 mg/dL — ABNORMAL LOW (ref 8.9–10.3)
Chloride: 116 mmol/L — ABNORMAL HIGH (ref 98–111)
Creatinine, Ser: 0.92 mg/dL (ref 0.61–1.24)
GFR calc Af Amer: >60 mL/min (ref >60)
GFR calc non Af Amer: >60 mL/min (ref >60)
Glucose, Bld: 82 mg/dL (ref 70–99)
Potassium: 3.9 mmol/L (ref 3.5–5.1)
Sodium: 148 mmol/L — ABNORMAL HIGH (ref 135–145)

## 2019-10-16 LAB — PHOSPHORUS: Phosphorus: 2.3 mg/dL — ABNORMAL LOW (ref 2.5–4.6)

## 2019-10-16 LAB — MAGNESIUM: Magnesium: 2.7 mg/dL — ABNORMAL HIGH (ref 1.7–2.4)

## 2019-10-16 MED ORDER — K PHOS MONO-SOD PHOS DI & MONO 155-852-130 MG PO TABS
500.0000 mg | ORAL_TABLET | ORAL | Status: AC
  Administered 2019-10-16 (×2): 500 mg via ORAL
  Filled 2019-10-16 (×2): qty 2

## 2019-10-16 MED ORDER — BOOST / RESOURCE BREEZE PO LIQD CUSTOM
1.0000 | Freq: Three times a day (TID) | ORAL | Status: DC
  Administered 2019-10-16 – 2019-10-20 (×12): 1 via ORAL

## 2019-10-16 NOTE — Consult Note (Signed)
WOC Nurse ostomy consult  New ostomy consult received. Patient has already been seen by [facility] nurse and pouch changed on Thursday, 10/15/19. Next scheduled visit is Monday, 9.27.  Thanks, Ladona Mow, MSN, RN, GNP, [facility] [place]  Pager# [phone]

## 2019-10-16 NOTE — Evaluation (Signed)
Physical Therapy Evaluation Patient Details Name: Harold Barber MRN: [id number] DOB: [birth year] Today's Date: 10/16/2019   History of Present Illness  86 y.o. male admitted with Sepsis due to Perforated Diverticulitis requiring Exploratory Laparotomy and Hartmann's Procedure.  Was intubated post procedure.  Pt with basline dementia that is exacerbated this admission (pt has pulled out his NG tube, tele sitter at time of exam)  Clinical Impression  Pt continues to be very confused, did not know date, location, situation, recognize wife, etc.  He was able to follow some basic cues for strength testing, mobility, transfers and standing/walking attempt, but ultimately was very functionally limited 2/2 strength/cognition. Clarified with ortho for no precautions regarding spine, pt only c/o pain in abdomen/surgical site and not in back.  Pt will clearly need rehab when cleared medically for discharge.        Follow Up Recommendations SNF    Equipment Recommendations  Rolling walker with 5" wheels (to be determinted at [facility])    Recommendations for Other Services       Precautions / Restrictions Precautions Precautions: Fall Precaution Comments: no back related precautions per ortho Restrictions Weight Bearing Restrictions: No      Mobility  Bed Mobility Overal bed mobility: Needs Assistance Bed Mobility: Supine to Sit     Supine to sit: Mod assist     General bed mobility comments: Pt able to show a little effort in getting to EOB, ultimately however he needed a lot of assist to attain sitting EOB  Transfers Overall transfer level: Needs assistance Equipment used: Rolling walker (2 wheeled) Transfers: Sit to/from Stand Sit to Stand: Mod assist;Max assist         General transfer comment: heavy assist with getting to standing, again able to show some light assist but PT needed to initiate rise to standing and with sustaining continued rise to upright  Ambulation/Gait              General Gait Details: difficult to categorize shuffling side steps EOB to recliner as ambulation but he was able to take some weight through UEs/RW and show effort with lifting/moving/unweighting  Stairs            Wheelchair Mobility    Modified Rankin (Stroke Patients Only)       Balance Overall balance assessment: Needs assistance   Sitting balance-Leahy Scale: Fair Sitting balance - Comments: Pt leaning L in sitting, but was able to maintain sitting w/o hands on assist most of the time     Standing balance-Leahy Scale: Poor Standing balance comment: reliant on walker, leaning L, though again once assisted to put weight through UEs/walker and get LEs under him he was able to maintain static standing w/o direct assist some of the time, however requiring at least min assist most of the time                             Pertinent Vitals/Pain Pain Assessment: Faces Faces Pain Scale: Hurts little more Pain Location: reports abdominal pain, did not c/o back pain    Home Living Family/patient expects to be discharged to:: Skilled nursing facility Living Arrangements: Spouse/significant other Available Help at Discharge: Family           Home Equipment: Gilmer Mor - single point      Prior Function Level of Independence: Needs assistance   Gait / Transfers Assistance Needed: apparently he did not use AD but often holds onto  wife's arm - rarely out of the home.           Hand Dominance        Extremity/Trunk Assessment   Upper Extremity Assessment Upper Extremity Assessment: Generalized weakness (able to give some against gravity resistance)    Lower Extremity Assessment Lower Extremity Assessment: Generalized weakness       Communication   Communication: Expressive difficulties;HOH  Cognition Arousal/Alertness: Awake/alert Behavior During Therapy: Restless Overall Cognitive Status:  (significant baseline dementia, acutely  exacerbated)                                        General Comments      Exercises     Assessment/Plan    PT Assessment Patient needs continued PT services  PT Problem List Decreased strength;Decreased range of motion;Decreased activity tolerance;Decreased balance;Decreased mobility;Decreased coordination;Decreased cognition;Decreased knowledge of use of DME;Decreased safety awareness;Pain       PT Treatment Interventions DME instruction;Gait training;Stair training;Functional mobility training;Therapeutic activities;Therapeutic exercise;Balance training;Neuromuscular re-education;Patient/family education    PT Goals (Current goals can be found in the Care Plan section)  Acute Rehab PT Goals Patient Stated Goal: get back to walkign PT Goal Formulation: With patient Time For Goal Achievement: 10/30/19 Potential to Achieve Goals: Fair    Frequency Min 2X/week   Barriers to discharge        Co-evaluation               AM-PAC PT "6 Clicks" Mobility  Outcome Measure Help needed turning from your back to your side while in a flat bed without using bedrails?: A Lot Help needed moving from lying on your back to sitting on the side of a flat bed without using bedrails?: A Lot Help needed moving to and from a bed to a chair (including a wheelchair)?: A Lot Help needed standing up from a chair using your arms (e.g., wheelchair or bedside chair)?: A Lot Help needed to walk in hospital room?: Total Help needed climbing 3-5 steps with a railing? : Total 6 Click Score: 10    End of Session Equipment Utilized During Treatment: Gait belt Activity Tolerance: Patient limited by fatigue Patient left: with chair alarm set;with call bell/phone within reach Nurse Communication: Mobility status PT Visit Diagnosis: Muscle weakness (generalized) (M62.81);Difficulty in walking, not elsewhere classified (R26.2);Unsteadiness on feet (R26.81)    Time: 0818-0900 PT Time  Calculation (min) (ACUTE ONLY): 42 min   Charges:   PT Evaluation $PT Eval Moderate Complexity: 1 Mod PT Treatments $Therapeutic Activity: 8-22 mins        Malachi Pro, DPT 10/16/2019, 11:04 AM

## 2019-10-16 NOTE — Care Management Important Message (Signed)
Important Message  Patient Details  Name: Harold Barber MRN: [id number] Date of Birth: Jun 02, 1930   Medicare Important Message Given:  Yes     [name] 10/16/2019, 11:47 AM

## 2019-10-16 NOTE — NC FL2 (Signed)
[name] MEDICAID FL2 LEVEL OF CARE SCREENING TOOL     IDENTIFICATION  Patient Name: Harold Barber Birthdate: 1930-11-06 Sex: male Admission Date (Current Location): 10/13/2019  [facility] and [place] Number:  Chiropodist and Address:         Provider Number: [id number]  Attending Physician Name and Address:  Henrene Dodge, MD  Relative Name and Phone Number:       Current Level of Care: Hospital Recommended Level of Care: Skilled Nursing Facility Prior Approval Number:    Date Approved/Denied:   PASRR Number: [id number]  Discharge Plan: SNF    Current Diagnoses: Patient Active Problem List   Diagnosis Date Noted  . Diverticulitis of large intestine with perforation without bleeding 10/13/2019    Orientation RESPIRATION BLADDER Height & Weight     Self  Normal Continent Weight: 72.5 kg Height:  5\' 5"  (165.1 cm)  BEHAVIORAL SYMPTOMS/MOOD NEUROLOGICAL BOWEL NUTRITION STATUS      Ileostomy Diet (Clears. plan to advance prior to dischage)  AMBULATORY STATUS COMMUNICATION OF NEEDS Skin   Extensive Assist Verbally Surgical wounds, Bruising                       Personal Care Assistance Level of Assistance              Functional Limitations Info             SPECIAL CARE FACTORS FREQUENCY  PT (By licensed PT), OT (By licensed OT)                    Contractures Contractures Info: Not present    Additional Factors Info  Code Status, Allergies Code Status Info: Full Allergies Info: Statins           Current Medications (10/16/2019):  This is the current hospital active medication list Current Facility-Administered Medications  Medication Dose Route Frequency Provider Last Rate Last Admin  . 0.9 %  sodium chloride infusion   Intravenous PRN 10/18/2019, MD 10 mL/hr at 10/16/19 1604 250 mL at 10/16/19 1604  . Chlorhexidine Gluconate Cloth 2 % PADS 6 each  6 each Topical Q0600 10/18/19, MD   6 each at 10/16/19 0434  .  donepezil (ARICEPT) tablet 10 mg  10 mg Per NG tube Daily Piscoya, Jose, MD   10 mg at 10/16/19 1023  . enoxaparin (LOVENOX) injection 40 mg  40 mg Subcutaneous Q24H Piscoya, Jose, MD   40 mg at 10/16/19 1023  . feeding supplement (BOOST / RESOURCE BREEZE) liquid 1 Container  1 Container Oral TID BM 10/18/19, PA-C      . HYDROmorphone (DILAUDID) injection 0.5 mg  0.5 mg Intravenous Q4H PRN Piscoya, Jose, MD   0.5 mg at 10/15/19 0206  . ipratropium-albuterol (DUONEB) 0.5-2.5 (3) MG/3ML nebulizer solution 3 mL  3 mL Nebulization Q4H PRN Piscoya, Jose, MD      . ketorolac (TORADOL) 30 MG/ML injection 15 mg  15 mg Intravenous Q6H Piscoya, Jose, MD   15 mg at 10/16/19 1258  . lactated ringers infusion   Intravenous Continuous 10/18/19, PA-C 50 mL/hr at 10/16/19 1600 Rate Change at 10/16/19 1600  . MEDLINE mouth rinse  15 mL Mouth Rinse BID Piscoya, Jose, MD   15 mL at 10/16/19 1023  . memantine (NAMENDA) tablet 5 mg  5 mg Per Tube BID 10/18/19, MD   5 mg at 10/16/19 1023  . ondansetron (ZOFRAN-ODT)  disintegrating tablet 4 mg  4 mg Oral Q6H PRN Piscoya, Jose, MD       Or  . ondansetron (ZOFRAN) injection 4 mg  4 mg Intravenous Q6H PRN Piscoya, Jose, MD      . pantoprazole (PROTONIX) injection 40 mg  40 mg Intravenous QHS Piscoya, Jose, MD   40 mg at 10/15/19 2155  . phosphorus (K PHOS NEUTRAL) tablet 500 mg  500 mg Oral Q4H Lynden Oxford R, PA-C   500 mg at 10/16/19 1605  . piperacillin-tazobactam (ZOSYN) IVPB 3.375 g  3.375 g Intravenous Q8H Piscoya, Jose, MD 12.5 mL/hr at 10/16/19 1605 3.375 g at 10/16/19 1605     Discharge Medications: Please see discharge summary for a list of discharge medications.  Relevant Imaging Results:  Relevant Lab Results:   Additional Information [id number]  Chapman Fitch, RN

## 2019-10-16 NOTE — Progress Notes (Addendum)
[facility] SURGICAL PROGRESS NOTE  Hospital Day(s): 3.   Post op day(s): 3 Days Post-Op.   Interval History:  Patient seen and examined NGT removed and replaced overnight, otherwise no acute events or new complaints overnight.  Patient is up on the side of the bed working with therapy He remains much more alert and lucid, denied any pain Leukocytosis has resolved this morning, WBC now 8.3K Renal function remains normal, sCr - 0.92, UO - 1.4 Still with mild hypernatremia (148) and hyperchloremia (116), also with mild hypermagnesemia (2.7) and hypophosphatemia (2.3) NGT output recorded at 100 ccs Surgical drain in RLQ with 60 ccs out Now with marked gas in colostomy bag, family noted they can hear him passing gas  Vital signs in last 24 hours: [min-max] current  Temp:  [98.1 F (36.7 C)-98.6 F (37 C)] 98.4 F (36.9 C) (09/24 0529) Pulse Rate:  [69-83] 69 (09/24 0529) Resp:  [10-20] 20 (09/24 0529) BP: (91-132)/(53-88) 129/62 (09/24 0529) SpO2:  [94 %-100 %] 100 % (09/24 0529)     Height: 5\' 5"  (165.1 cm) Weight: 72.5 kg BMI (Calculated): 26.6   Intake/Output last 2 shifts:  09/23 0701 - 09/24 0700 In: 275.1 [I.V.:250; IV Piggyback:25] Out: 1635 [Urine:1475; Emesis/NG output:100; Drains:60]   Physical Exam:  Constitutional: alert,much more lucid, sitting on side of bed with therapy Respiratory:Breathing non-labored, no respiratory distress Cardiovascular: regular rate and sinus rhythm, few PVCs on monitor Gastrointestinal:Soft, winces with incisional palpation, non-distended, drain in RLQ with serosanguinous output, colostomy in left mid-abdomen, pink/patent, now with marked gas in bag Integumentary:Laparotomy incision is CDI with staple, minimal output on honeycomb, no erythema appreciated  Labs:  CBC Latest Ref Rng & Units 10/16/2019 10/15/2019 10/14/2019  WBC 4.0 - 10.5 K/uL 8.3 12.0(H) 16.0(H)  Hemoglobin 13.0 - 17.0 g/dL 10/16/2019 99.8 33.8  Hematocrit  39 - 52 % 38.9(L) 43.4 50.0  Platelets 150 - 400 K/uL 154 171 217   CMP Latest Ref Rng & Units 10/16/2019 10/15/2019 10/14/2019  Glucose 70 - 99 mg/dL 82 10/16/2019) 539(J)  BUN 8 - 23 mg/dL 673(A) 19(F) 79(K)  Creatinine 0.61 - 1.24 mg/dL 24(O 9.73 5.32  Sodium 135 - 145 mmol/L 148(H) 149(H) 144  Potassium 3.5 - 5.1 mmol/L 3.9 4.9 5.0  Chloride 98 - 111 mmol/L 116(H) 117(H) 115(H)  CO2 22 - 32 mmol/L 24 21(L) 18(L)  Calcium 8.9 - 10.3 mg/dL 7.6(L) 8.0(L) 7.4(L)  Total Protein 6.5 - 8.1 g/dL - - -  Total Bilirubin 0.3 - 1.2 mg/dL - - -  Alkaline Phos 38 - 126 U/L - - -  AST 15 - 41 U/L - - -  ALT 0 - 44 U/L - - -    Imaging studies: No new pertinent imaging studies   Assessment/Plan:  86 y.o. male with evidence of bowel function return and gas in colostomy bag 3 Days Post-Op s/p Hartman's Procedurefor pneumoperitoneum for likely perforated diverticulitis, complicated by pertinent comorbidities includingadvanced age and dementia.   - I discussed case with Dr 97 (orthopedics) given his history of L1 compression fracture prior to presentation. Recommended lumbar MRI when feasible and outpatient follow up to consider kyphoplasty.    - Now with bowel function return, we can remove NGT  - I will start CLD; continue IVF resuscitation             - Continue IV ABx (Zosyn - Day 3/14)             - Monitor abdominal examination -  Monitor colostomy output; WOC following - Maintain RLQ drain; monitor and record output - Discontinue foley  - Mobilize with therapies  - DVT prophylaxis   All of the above findings and recommendations were discussed with the patient, and the medical team.  -- Lynden Oxford, PA-C [facility] 10/16/2019, 7:24 AM [phone] M-F: 7am - 4pm

## 2019-10-16 NOTE — TOC Initial Note (Signed)
Transition of Care [facility]) - Initial/Assessment Note    Patient Details  Name: Harold Barber MRN: [id number] Date of Birth: 1930-11-23  [facility] CM/SW Contact:    Chapman Fitch, RN Phone Number: 10/16/2019, 4:47 PM  Clinical Narrative:                  Patient with hx dementia.   Daughter and wife at beside  Patient lives at home with wife [facility] - denies issues obtaining medications  Wife provides transportation to appointments  Patient has a quad cane at home but does not like to use it  PT has assessed patient and recommends SNF Wife and daughter in agreement   PASRR obtained [facility] sent for signature Bed search initiated  Please call daughter susan with bed offers  Patient currently has telesitter.  Will have to be sitter free for 24 hours prior to discharge   Expected Discharge Plan: Skilled Nursing Facility Barriers to Discharge: Continued Medical Work up   Patient Goals and CMS Choice        Expected Discharge Plan and Services Expected Discharge Plan: Skilled Nursing Facility       Living arrangements for the past 2 months: Single Family Home                                      Prior Living Arrangements/Services Living arrangements for the past 2 months: Single Family Home Lives with:: Spouse Patient language and need for interpreter reviewed:: Yes Do you feel safe going back to the place where you live?: Yes      Need for Family Participation in Patient Care: Yes (Comment) Care giver support system in place?: Yes (comment) Current home services: DME Criminal Activity/Legal Involvement Pertinent to Current Situation/Hospitalization: No - Comment as needed  Activities of Daily Living Home Assistive Devices/Equipment: Walker (specify type) ADL Screening (condition at time of admission) Patient's cognitive ability adequate to safely complete daily activities?: No Is the patient deaf or have difficulty  hearing?: No Does the patient have difficulty seeing, even when wearing glasses/contacts?: No Does the patient have difficulty concentrating, remembering, or making decisions?: Yes Patient able to express need for assistance with ADLs?: No Does the patient have difficulty dressing or bathing?: Yes Independently performs ADLs?: No Communication: Needs assistance Is this a change from baseline?: Pre-admission baseline Dressing (OT): Needs assistance Is this a change from baseline?: Pre-admission baseline Grooming: Needs assistance Is this a change from baseline?: Pre-admission baseline Feeding: Needs assistance Is this a change from baseline?: Pre-admission baseline Bathing: Needs assistance Is this a change from baseline?: Pre-admission baseline Toileting: Needs assistance Is this a change from baseline?: Pre-admission baseline In/Out Bed: Needs assistance Is this a change from baseline?: Pre-admission baseline Walks in Home: Needs assistance Is this a change from baseline?: Pre-admission baseline Does the patient have difficulty walking or climbing stairs?: Yes Weakness of Legs: Both Weakness of Arms/Hands: None  Permission Sought/Granted                  Emotional Assessment       Orientation: : Oriented to Self   Psych Involvement: No (comment)  Admission diagnosis:  Perforated abdominal viscus [R19.8] Diverticulitis of large intestine with perforation without bleeding [K57.20] Altered mental status, unspecified altered mental status type [R41.82] Patient Active Problem List   Diagnosis Date Noted   Diverticulitis of large  intestine with perforation without bleeding 10/13/2019   PCP:  Leim Fabry, MD Pharmacy:   [phone] [place] - [address] [place] [address] Phone: [phone] Fax: [phone]     Social Determinants of Health (SDOH) Interventions    Readmission Risk Interventions No flowsheet  data found.

## 2019-10-16 NOTE — Progress Notes (Signed)
Initial Nutrition Assessment  DOCUMENTATION CODES:   Not applicable  INTERVENTION:   Boost Breeze po TID, each supplement provides 250 kcal and 9 grams of protein  Pt at high refeed risk; recommend monitor potassium, magnesium and phosphorus labs daily until stable  NUTRITION DIAGNOSIS:   Inadequate oral intake related to acute illness as evidenced by per patient/family report.  GOAL:   Patient will meet greater than or equal to 90% of their needs  MONITOR:   PO intake, Supplement acceptance, Diet advancement, Labs, Skin, I & O's, Weight trends  REASON FOR ASSESSMENT:   Malnutrition Screening Tool    ASSESSMENT:   86 y.o. male s/p Hartman's Procedure 9/21 for pneumoperitoneum for likely perforated diverticulitis, complicated by pertinent comorbidities including advanced age, kidney stones, L1 fracture and dementia.  Met with pt in room today. Pt unable to provide much nutrition related history; history provided by family at bedside. Per family report, pt with poor appetite and oral intake for 2 weeks pta. Family reports pt eating only bites of meals and drinking some chocolate Ensure. Pt NPO since admit; pt advanced to a clear liquid diet today. NGT removed. Drains with 24m/hr. RD will add supplements to help pt meet his estimated needs. Pt is at refeed risk. Per chart, pt is weight stable at baseline.   Medications reviewed and include: lovenox, protonix, LRS _0 /hr, zosyn  Labs reviewed: Na 148(H), P 2.3(L), Mg 2.7(H)  NUTRITION - FOCUSED PHYSICAL EXAM:    Most Recent Value  Orbital Region No depletion  Upper Arm Region Mild depletion  Thoracic and Lumbar Region Mild depletion  Buccal Region No depletion  Temple Region No depletion  Clavicle Bone Region No depletion  Clavicle and Acromion Bone Region No depletion  Scapular Bone Region No depletion  Dorsal Hand No depletion  Patellar Region Moderate depletion  Anterior Thigh Region Moderate depletion   Posterior Calf Region Moderate depletion  Edema (RD Assessment) None  Hair Reviewed  Eyes Reviewed  Mouth Reviewed  Skin Reviewed  Nails Reviewed     Diet Order:   Diet Order            Diet clear liquid Room service appropriate? Yes; Fluid consistency: Thin  Diet effective now                EDUCATION NEEDS:   Education needs have been addressed (with family at bedside)  Skin:  Skin Assessment: Reviewed RN Assessment (incision abdomen)  Last BM:  9/23- small amount via ostomy  Height:   Ht Readings from Last 1 Encounters:  10/14/19 _1  (1.651 m)    Weight:   Wt Readings from Last 1 Encounters:  10/13/19 72.5 kg    Ideal Body Weight:  61.8 kg  BMI:  Body mass index is 26.6 kg/m.  Estimated Nutritional Needs:   Kcal:  1700-1900kcal/day  Protein:  85-95g/day  Fluid:  >1.5L/day  CKoleen DistanceMS, RD, LDN Please refer to AH[facility] Incfor RD and/or RD on-call/weekend/after hours pager

## 2019-10-16 NOTE — Progress Notes (Signed)
Paged On call provider, Dr. Claudine Mouton to notify him that pt removed is NGT. Also asked for orders for a tele sitter to help in the future.   0320-Placed another NGT in right nare and confirmed placement with Xray. Will continue to monitor.

## 2019-10-16 NOTE — [place] FL2 (Signed)
MEDICAID FL2 LEVEL OF CARE SCREENING TOOL     IDENTIFICATION  Patient Name: Harold Barber Birthdate: May 17, 1930 Sex: male Admission Date (Current Location): 10/13/2019  [facility] and [place] Number:  Chiropodist and Address:         Provider Number: [id number]  Attending Physician Name and Address:  Henrene Dodge, MD  Relative Name and Phone Number:       Current Level of Care: Hospital Recommended Level of Care: Skilled Nursing Facility Prior Approval Number:    Date Approved/Denied:   PASRR Number: [id number]  Discharge Plan: SNF    Current Diagnoses: Patient Active Problem List   Diagnosis Date Noted  . Diverticulitis of large intestine with perforation without bleeding 10/13/2019    Orientation RESPIRATION BLADDER Height & Weight     Self  Normal Continent Weight: 72.5 kg Height:  5\' 5"  (165.1 cm)  BEHAVIORAL SYMPTOMS/MOOD NEUROLOGICAL BOWEL NUTRITION STATUS      Colostomy Diet (Clears. plan to advance prior to dischage)  AMBULATORY STATUS COMMUNICATION OF NEEDS Skin   Extensive Assist Verbally Surgical wounds, Bruising                       Personal Care Assistance Level of Assistance              Functional Limitations Info             SPECIAL CARE FACTORS FREQUENCY  PT (By licensed PT), OT (By licensed OT)                    Contractures Contractures Info: Not present    Additional Factors Info  Code Status, Allergies Code Status Info: Full Allergies Info: Statins           Current Medications (10/16/2019):  This is the current hospital active medication list Current Facility-Administered Medications  Medication Dose Route Frequency Provider Last Rate Last Admin  . 0.9 %  sodium chloride infusion   Intravenous PRN 10/18/2019, MD 10 mL/hr at 10/16/19 1604 250 mL at 10/16/19 1604  . Chlorhexidine Gluconate Cloth 2 % PADS 6 each  6 each Topical Q0600 10/18/19, MD   6 each at 10/16/19 0434  .  donepezil (ARICEPT) tablet 10 mg  10 mg Per NG tube Daily Piscoya, Jose, MD   10 mg at 10/16/19 1023  . enoxaparin (LOVENOX) injection 40 mg  40 mg Subcutaneous Q24H Piscoya, Jose, MD   40 mg at 10/16/19 1023  . feeding supplement (BOOST / RESOURCE BREEZE) liquid 1 Container  1 Container Oral TID BM 10/18/19, PA-C      . HYDROmorphone (DILAUDID) injection 0.5 mg  0.5 mg Intravenous Q4H PRN Piscoya, Jose, MD   0.5 mg at 10/15/19 0206  . ipratropium-albuterol (DUONEB) 0.5-2.5 (3) MG/3ML nebulizer solution 3 mL  3 mL Nebulization Q4H PRN Piscoya, Jose, MD      . ketorolac (TORADOL) 30 MG/ML injection 15 mg  15 mg Intravenous Q6H Piscoya, Jose, MD   15 mg at 10/16/19 1258  . lactated ringers infusion   Intravenous Continuous 10/18/19, PA-C 50 mL/hr at 10/16/19 1600 Rate Change at 10/16/19 1600  . MEDLINE mouth rinse  15 mL Mouth Rinse BID Piscoya, Jose, MD   15 mL at 10/16/19 1023  . memantine (NAMENDA) tablet 5 mg  5 mg Per Tube BID 10/18/19, MD   5 mg at 10/16/19 1023  . ondansetron (ZOFRAN-ODT)  disintegrating tablet 4 mg  4 mg Oral Q6H PRN Piscoya, Jose, MD       Or  . ondansetron (ZOFRAN) injection 4 mg  4 mg Intravenous Q6H PRN Piscoya, Jose, MD      . pantoprazole (PROTONIX) injection 40 mg  40 mg Intravenous QHS Piscoya, Jose, MD   40 mg at 10/15/19 2155  . phosphorus (K PHOS NEUTRAL) tablet 500 mg  500 mg Oral Q4H Lynden Oxford R, PA-C   500 mg at 10/16/19 1605  . piperacillin-tazobactam (ZOSYN) IVPB 3.375 g  3.375 g Intravenous Q8H Piscoya, Jose, MD 12.5 mL/hr at 10/16/19 1605 3.375 g at 10/16/19 1605     Discharge Medications: Please see discharge summary for a list of discharge medications.  Relevant Imaging Results:  Relevant Lab Results:   Additional Information [id number]  Chapman Fitch, RN

## 2019-10-17 LAB — PHOSPHORUS: Phosphorus: 2.9 mg/dL (ref 2.5–4.6)

## 2019-10-17 LAB — COMPREHENSIVE METABOLIC PANEL
ALT: 67 U/L — ABNORMAL HIGH (ref 0–44)
AST: 72 U/L — ABNORMAL HIGH (ref 15–41)
Albumin: 2.2 g/dL — ABNORMAL LOW (ref 3.5–5.0)
Alkaline Phosphatase: 86 U/L (ref 38–126)
Anion gap: 6 (ref 5–15)
BUN: 19 mg/dL (ref 8–23)
CO2: 25 mmol/L (ref 22–32)
Calcium: 7.7 mg/dL — ABNORMAL LOW (ref 8.9–10.3)
Chloride: 113 mmol/L — ABNORMAL HIGH (ref 98–111)
Creatinine, Ser: 0.83 mg/dL (ref 0.61–1.24)
GFR calc Af Amer: >60 mL/min (ref >60)
GFR calc non Af Amer: >60 mL/min (ref >60)
Glucose, Bld: 99 mg/dL (ref 70–99)
Potassium: 3.9 mmol/L (ref 3.5–5.1)
Sodium: 144 mmol/L (ref 135–145)
Total Bilirubin: 0.8 mg/dL (ref 0.3–1.2)
Total Protein: 5.2 g/dL — ABNORMAL LOW (ref 6.5–8.1)

## 2019-10-17 LAB — MAGNESIUM: Magnesium: 2.4 mg/dL (ref 1.7–2.4)

## 2019-10-17 MED ORDER — FUROSEMIDE 10 MG/ML IJ SOLN
40.0000 mg | Freq: Once | INTRAMUSCULAR | Status: AC
  Administered 2019-10-17: 40 mg via INTRAVENOUS
  Filled 2019-10-17: qty 4

## 2019-10-17 NOTE — Progress Notes (Signed)
Patient not having a great deal back pain.  I talked with him and his wife about his MRI findings that he has a significant L1 fracture with a great deal of edema.  As he mobilizes he may have increasing pain and then we would consider kyphoplasty.  On the other hand he may be slow down as he recovers from his abdominal surgery and the fracture may heal.  He will call our office if he has increasing pain and will address the L1 fracture at that time.

## 2019-10-17 NOTE — Progress Notes (Signed)
POD # 9 s/p hartmann's Doing better Taking PO, colostomy working Some delirium yesterday AVSS  PE NAD, more lucid today, no mittens today Chest: some congestion Abd: soft, incision c/d/i, ostomy pink and patent. JP serous  A/P Doing well A bit volume overload , one dose lasix advance diet DC 38-72 hrs , deconditioning is his main issue I am not sure if kyphoplasty is a priority at this time on this debilitated and pleasantly demented pt

## 2019-10-18 LAB — CBC
HCT: 41.1 % (ref 39.0–52.0)
Hemoglobin: 14.6 g/dL (ref 13.0–17.0)
MCH: 31.6 pg (ref 26.0–34.0)
MCHC: 35.5 g/dL (ref 30.0–36.0)
MCV: 89 fL (ref 80.0–100.0)
Platelets: 214 10*3/uL (ref 150–400)
RBC: 4.62 MIL/uL (ref 4.22–5.81)
RDW: 13.1 % (ref 11.5–15.5)
WBC: 8.8 10*3/uL (ref 4.0–10.5)
nRBC: 0 % (ref 0.0–0.2)

## 2019-10-18 LAB — COMPREHENSIVE METABOLIC PANEL
ALT: 67 U/L — ABNORMAL HIGH (ref 0–44)
AST: 67 U/L — ABNORMAL HIGH (ref 15–41)
Albumin: 2.4 g/dL — ABNORMAL LOW (ref 3.5–5.0)
Alkaline Phosphatase: 98 U/L (ref 38–126)
Anion gap: 11 (ref 5–15)
BUN: 14 mg/dL (ref 8–23)
CO2: 25 mmol/L (ref 22–32)
Calcium: 7.8 mg/dL — ABNORMAL LOW (ref 8.9–10.3)
Chloride: 106 mmol/L (ref 98–111)
Creatinine, Ser: 0.84 mg/dL (ref 0.61–1.24)
GFR calc Af Amer: >60 mL/min (ref >60)
GFR calc non Af Amer: >60 mL/min (ref >60)
Glucose, Bld: 96 mg/dL (ref 70–99)
Potassium: 3.2 mmol/L — ABNORMAL LOW (ref 3.5–5.1)
Sodium: 142 mmol/L (ref 135–145)
Total Bilirubin: 1 mg/dL (ref 0.3–1.2)
Total Protein: 5.4 g/dL — ABNORMAL LOW (ref 6.5–8.1)

## 2019-10-18 NOTE — Progress Notes (Signed)
POD # 10 s/p hartmann's Doing better Taking PO, colostomy working  delirium improving AVSS Did well with lasix  Labs ok Drain 50cc  PE NAD, lucid today,  Chest: some congestion Abd: soft, incision c/d/i, ostomy pink and patent. JP serous  A/P Doing well advance diet DC 24-48 hrs , deconditioning is his main issue I am not sure if kyphoplasty is a priority at this time on this debilitated and pleasantly demented pt

## 2019-10-19 LAB — BASIC METABOLIC PANEL
Anion gap: 7 (ref 5–15)
BUN: 13 mg/dL (ref 8–23)
CO2: 25 mmol/L (ref 22–32)
Calcium: 7.9 mg/dL — ABNORMAL LOW (ref 8.9–10.3)
Chloride: 108 mmol/L (ref 98–111)
Creatinine, Ser: 0.88 mg/dL (ref 0.61–1.24)
GFR calc Af Amer: >60 mL/min (ref >60)
GFR calc non Af Amer: >60 mL/min (ref >60)
Glucose, Bld: 105 mg/dL — ABNORMAL HIGH (ref 70–99)
Potassium: 3.6 mmol/L (ref 3.5–5.1)
Sodium: 140 mmol/L (ref 135–145)

## 2019-10-19 LAB — CULTURE, BLOOD (ROUTINE X 2)
Culture: NO GROWTH
Culture: NO GROWTH
Special Requests: ADEQUATE
Special Requests: ADEQUATE

## 2019-10-19 NOTE — TOC Progression Note (Signed)
Transition of Care [facility]) - Progression Note    Patient Details  Name: OSUALDO HANSELL MRN: [id number] Date of Birth: 1930-08-25  Transition of Care [facility]) CM/SW Contact  Chapman Fitch, RN Phone Number: 10/19/2019, 9:33 AM  Clinical Narrative:    Bed offer presented to daughter Darl Pikes.  Accepted bed at [facility].  Accepted in HUB.  Insurance auth started through portable. Telesiter to be discontinued today.  Will have to be sitter free for 24 hours   Expected Discharge Plan: Skilled Nursing Facility Barriers to Discharge: Continued Medical Work up  Expected Discharge Plan and Services Expected Discharge Plan: Skilled Nursing Facility       Living arrangements for the past 2 months: Single Family Home                                       Social Determinants of Health (SDOH) Interventions    Readmission Risk Interventions No flowsheet data found.

## 2019-10-19 NOTE — Care Management Important Message (Signed)
Important Message  Patient Details  Name: Harold Barber MRN: [id number] Date of Birth: 09-Mar-1930   Medicare Important Message Given:  Yes     [name] 10/19/2019, 12:47 PM

## 2019-10-19 NOTE — Progress Notes (Signed)
The plan is for the patient to discharge to [facility] possibly tomorrow, 10/20/19. Order received from Lynden Oxford to discontinue tele sitter

## 2019-10-19 NOTE — Progress Notes (Addendum)
[facility] SURGICAL PROGRESS NOTE  Hospital Day(s): 6.   Post op day(s): 6 Days Post-Op.   Interval History:  Patient seen and examined no acute events or new complaints overnight.  Patient resting comfortably, appears lucid No reports of pain BMP is reassuring, renal function normal with sCr - 0.88, good UO No electrolyte abnormalities appreciable  Tolerating regular diet, + colostomy function Working with therapies; recommending SNF; CSW following No new issues  Vital signs in last 24 hours: [min-max] current  Temp:  [97.6 F (36.4 C)-99 F (37.2 C)] 98.1 F (36.7 C) (09/27 0559) Pulse Rate:  [78-84] 78 (09/27 0559) Resp:  [20] 20 (09/27 0559) BP: (127-142)/(68-88) 138/88 (09/27 0559) SpO2:  [95 %-96 %] 95 % (09/27 0559)     Height: 5\' 5"  (165.1 cm) Weight: 72.5 kg BMI (Calculated): 26.6   Intake/Output last 2 shifts:  09/26 0701 - 09/27 0700 In: 30  Out: 50 [Stool:50]   Physical Exam:  Constitutional: alert,much more lucid, sitting on side of bed with therapy Respiratory:Breathing non-labored, no respiratory distress Cardiovascular: regular rate and sinus rhythm, few PVCs on monitor Gastrointestinal:Soft, winces with incisional palpation, non-distended, drain in RLQ with serosanguinous output, colostomy in left mid-abdomen, pink/patent, now with marked gas in bag, brown stool present Integumentary:Laparotomy incision is CDI with staples, no erythema appreciated  Labs:  CBC Latest Ref Rng & Units 10/18/2019 10/16/2019 10/15/2019  WBC 4.0 - 10.5 K/uL 8.8 8.3 12.0(H)  Hemoglobin 13.0 - 17.0 g/dL 10/17/2019 99.3 71.6  Hematocrit 39 - 52 % 41.1 38.9(L) 43.4  Platelets 150 - 400 K/uL 214 154 171   CMP Latest Ref Rng & Units 10/18/2019 10/17/2019 10/16/2019  Glucose 70 - 99 mg/dL 96 99 82  BUN 8 - 23 mg/dL 14 19 10/18/2019)  Creatinine 0.61 - 1.24 mg/dL 89(F 8.10 1.75  Sodium 135 - 145 mmol/L 142 144 148(H)  Potassium 3.5 - 5.1 mmol/L 3.2(L) 3.9 3.9  Chloride 98  - 111 mmol/L 106 113(H) 116(H)  CO2 22 - 32 mmol/L 25 25 24   Calcium 8.9 - 10.3 mg/dL 7.8(L) 7.7(L) 7.6(L)  Total Protein 6.5 - 8.1 g/dL 1.02) 5.2(L) -  Total Bilirubin 0.3 - 1.2 mg/dL 1.0 0.8 -  Alkaline Phos 38 - 126 U/L 98 86 -  AST 15 - 41 U/L 67(H) 72(H) -  ALT 0 - 44 U/L 67(H) 67(H) -     Imaging studies: No new pertinent imaging studies   Assessment/Plan:  86 y.o. male doing well 6 Days Post-Op s/p Hartman's Procedurefor pneumoperitoneum for likely perforated diverticulitis, complicated by pertinent comorbidities includingadvanced age and dementia.   - Continue regular diet  - Continue IV ABx (Zosyn - Day6/14); transition to PO at home - Monitor abdominal examination - Monitor colostomy output;WOC following - Maintain RLQ drain; monitor and record output --> may be able to remove at discharge  - Mobilize with therapies; recommending SNF             - DVT prophylaxis     - Discharge Planning; Okay for discharge from surgical standpoint, has been accepted at SNF, d/w CSW and he needs to be off telesitter for 24 hours. We will plan on DC in AM tomorrow  All of the above findings and recommendations were discussed with the medical team.  -- 5.8(N, PA-C  [facility] 10/19/2019, 7:31 AM [phone] M-F: 7am - 4pm

## 2019-10-19 NOTE — Progress Notes (Signed)
Physical Therapy Treatment Patient Details Name: Harold Barber MRN: [id number] DOB: [birth year] Today's Date: 10/19/2019    History of Present Illness 86 y.o. male admitted with Sepsis due to Perforated Diverticulitis requiring Exploratory Laparotomy and Hartmann's Procedure.  Was intubated post procedure.  Pt with basline dementia that is exacerbated this admission (pt has pulled out his NG tube, tele sitter at time of exam)    PT Comments    Patient cooperative and agreeable to PT. Patient able to follow single step commands consistently. Patient needs Mod A for bed mobility with cues for technique and sequencing. Patient required Min A initially to maintain midline sitting balance, progressing to stand by assistance with increased time. Verbal and tactile cues for upright posture. Patient reports no pain with activity, however does grimace at times with bed mobility. Patient participated with LE therapeutic strengthening activities in bed. Current discharge plan for SNF remains appropriate. Recommend continued PT to maximize independence and address remaining functional limitations.    Follow Up Recommendations  SNF     Equipment Recommendations  Rolling walker with 5" wheels    Recommendations for Other Services       Precautions / Restrictions Precautions Precautions: Fall Restrictions Weight Bearing Restrictions: No    Mobility  Bed Mobility Overal bed mobility: Needs Assistance Bed Mobility: Sit to Supine;Supine to Sit     Supine to sit: Mod assist Sit to supine: Mod assist   General bed mobility comments: verbal cues for task segmentation and sequencing. tactile and verbal cues for technique. assistance for trunk support and occasional BLE support.   Transfers                 General transfer comment: patient with limited sitting tolerance related to fatigue and generalized weakness. patient declined standing this session   Ambulation/Gait                  Stairs             Wheelchair Mobility    Modified Rankin (Stroke Patients Only)       Balance Overall balance assessment: Needs assistance Sitting-balance support: Feet supported Sitting balance-Leahy Scale: Fair Sitting balance - Comments: posterior lean initially and minimal assistance required for repositioning towards edge of bed.                                     Cognition Arousal/Alertness: Awake/alert Behavior During Therapy: WFL for tasks assessed/performed Overall Cognitive Status: History of cognitive impairments - at baseline                                 General Comments: patient is pleasent and able to follow single step commands consistently without difficulty. family members at bedside today       Exercises General Exercises - Lower Extremity Heel Slides: AAROM;Strengthening;Both;10 reps;Supine Hip ABduction/ADduction: AAROM;Strengthening;Both;10 reps;Supine Other Exercises Other Exercises: verbal and visual cues for technique     General Comments        Pertinent Vitals/Pain Pain Assessment: No/denies pain Pain Location: patient denies pain throughout session, although does have facial grimicing with bed mobility (still reports no pain)     Home Living                      Prior Function  PT Goals (current goals can now be found in the care plan section) Acute Rehab PT Goals Patient Stated Goal: to regain strength  PT Goal Formulation: With patient Time For Goal Achievement: 10/30/19 Potential to Achieve Goals: Fair Progress towards PT goals: Progressing toward goals    Frequency    Min 2X/week      PT Plan Current plan remains appropriate    Co-evaluation              AM-PAC PT "6 Clicks" Mobility   Outcome Measure  Help needed turning from your back to your side while in a flat bed without using bedrails?: A Lot Help needed moving from lying on your back to  sitting on the side of a flat bed without using bedrails?: A Lot Help needed moving to and from a bed to a chair (including a wheelchair)?: A Lot Help needed standing up from a chair using your arms (e.g., wheelchair or bedside chair)?: A Lot Help needed to walk in hospital room?: Total Help needed climbing 3-5 steps with a railing? : Total 6 Click Score: 10    End of Session   Activity Tolerance: Patient tolerated treatment well Patient left: in bed;with call bell/phone within reach;with family/visitor present;with SCD's reapplied;with bed alarm set Nurse Communication: Mobility status PT Visit Diagnosis: Muscle weakness (generalized) (M62.81);Difficulty in walking, not elsewhere classified (R26.2);Unsteadiness on feet (R26.81)     Time: 1050-1109 PT Time Calculation (min) (ACUTE ONLY): 19 min  Charges:  $Therapeutic Activity: 8-22 mins                     Donna Bernard, PT, MPT    Ina Homes 10/19/2019, 12:49 PM

## 2019-10-19 NOTE — Consult Note (Addendum)
WOC Nurse ostomy follow up Stoma type/location: LLQ, end colostomy Stomal assessment/size: visualized via intact pouch. Pink, moist.  Peristomal assessment: NA Treatment options for stomal/peristomal skin: NA Output pasty reddish brown stool  Ostomy pouching: 2pc.2 1/4" in place, unclear if staff used 2" barrier when it was changed.   Education provided: patient with dementia; to be DC to SNF for care; will be dependent on care for ostomy Enrolled patient in [place] Secure Start Discharge program: Yes, wife signed paperwork.  Discussed ostomy care with wife.  Patient will be completely dependent in care, wife to learn while patient in rehab. Planning for DC today or tomorrow.   Pouch changed by beside nursing staff; additional pouches placed in room for DC to SNF.  Hermenia Fritcher [facility], CNS, [employer] [phone]

## 2019-10-19 NOTE — Progress Notes (Signed)
Order received from Lynden Oxford to remove/discontinue telemetry

## 2019-10-20 LAB — SARS CORONAVIRUS 2 BY RT PCR (HOSPITAL ORDER, PERFORMED IN ~~LOC~~ HOSPITAL LAB): SARS Coronavirus 2: NEGATIVE

## 2019-10-20 MED ORDER — IBUPROFEN 600 MG PO TABS: 600.0000 mg | ORAL_TABLET | Freq: Four times a day (QID) | ORAL | 0 refills | Status: AC | PRN

## 2019-10-20 MED ORDER — AMOXICILLIN-POT CLAVULANATE 875-125 MG PO TABS: 1.0000 | ORAL_TABLET | Freq: Two times a day (BID) | ORAL | 0 refills | Status: AC

## 2019-10-20 NOTE — Discharge Summary (Signed)
[facility] SUR[facility]ICAL DISCHARGE SUMMARY  Patient ID: Harold Barber MRN: [id number] DOB/AGE: Aug 03, 1930 86 y.o.  Admit date: 10/13/2019 Discharge date: 10/20/2019  Discharge Diagnoses Patient Active Problem List   Diagnosis Date Noted  . Diverticulitis of large intestine with perforation without bleeding 10/13/2019    Consultants None  Procedures 10/13/2019:  Hartman's Procedure - Dr Aleen Campi   HPI: Harold Barber is a 86 y.o. male presenting from home with altered mental status and abdominal pain.  His wife is at bedside.  He has a history of Alzheimer's dementia, and his wife is the historian.  She reports they were recently here on 9/16, two days after he fell at home.  He was diagnosed with an L1 compression fracture as well as bilateral kidney stones.  He had referrals with Urology (Dr. Apolinar Junes), and Orthopaedic Surgery (Mr. Floyce Stakes / Dr. Rosita Kea).  He was seen by Mr. Floyce Stakes today and there was discussion about possible kyphoplasty.  However, the patient was very altered and dehydrated and was sent to the ER for further evaluation.  The patient's wife reports that he's not been eating for the last couple of days and she's almost had to force him to drink fluids.  He has not had a bowel movement since almost a week, and has been complaining of worsening abdominal pain.  His mental status has also deteriorated and he's not been able to communicate well.  With his L1 fracture, he has also not been walking at home and has been bedbound.  In the ED today, he had labs showing a WBC of 22.9, Cr of 1.08, Hgb 19.4 (hemoconcentration), negative U/A except for moderate Hgb.  He had a head CT which was negative for acute pathology.  He had a CT scan of abdomen and pelvis which showed pneumoperitoneum with perforated diverticulitis.    Prior to his fall, the patient's wife reports that he was overall healthy, with progressing Alzheimer's over the past 5 years, but he walks around the house  on his own, they go to church every Sunday morning, but otherwise the activity level is more sedentary.  Denies any cardiac or pulmonary disease.  He does have history of diverticulitis in the past and per his wife and daughters, has had issues with this for many years.  Hospital Course: Informed consent was obtained and documented, and patient underwent uneventful Hartman's Procedure (Dr Aleen Campi, 10/13/2019).  Post-operatively, remained intubated in the ICU however was successfully extubated on POD1. He was able to be transferred out of the ICU on POD2. On POD3 he had return of bowel function and NGT was removed. Advancement of patient's diet and ambulation with therapies were well-tolerated. The remainder of patient's hospital course was essentially unremarkable, and discharge planning was initiated accordingly with patient safely able to be discharged home with appropriate discharge instructions, antibiotics (Augmentin x7 days to complete 14 days total), pain control, and outpatient follow-up after all of his family's questions were answered to their expressed satisfaction.   Discharge Condition: Good   Physical Examination:  Constitutional: alert,much more lucid,sitting on side of bed with therapy Respiratory:Breathing non-labored, no respiratory distress Cardiovascular: regular rate and sinus rhythm, few PVCs on monitor Gastrointestinal:Soft, non-tender, non-distended, drain in RLQ with serosanguinous output, colostomy in left mid-abdomen, pink/patent,now with marked gas in bag, brown stool present Integumentary:Laparotomy incision is CDI with staples, no erythema appreciated    Allergies as of 10/20/2019      Reactions   Statins    Other reaction(s): Other (  See Comments) Medically inappropriate. Advanced dementia      Medication List    TAKE these medications   amoxicillin-clavulanate 875-125 MG tablet Commonly known as: Augmentin Take 1 tablet by mouth 2 (two) times daily  for 7 days.   donepezil 10 MG tablet Commonly known as: ARICEPT Take 1 tablet by mouth daily.   ibuprofen 600 MG tablet Commonly known as: ADVIL Take 1 tablet (600 mg total) by mouth every 6 (six) hours as needed.   memantine 5 MG tablet Commonly known as: NAMENDA Take 5 mg by mouth 2 (two) times daily.   multivitamin with minerals Tabs tablet Take 1 tablet by mouth daily.   sulfamethoxazole-trimethoprim 800-160 MG tablet Commonly known as: BACTRIM DS Take 1 tablet by mouth in the morning and at bedtime.   traMADol 50 MG tablet Commonly known as: Ultram Take 1 tablet (50 mg total) by mouth every 6 (six) hours as needed.   vitamin B-12 250 MCG tablet Commonly known as: CYANOCOBALAMIN Take 250 mcg by mouth daily.         Contact information for follow-up providers    Donovan Kail, PA-C. Schedule an appointment as soon as possible for a visit in 1 week(s).   Specialty: Physician Assistant Why: s/p Hartman's, has staples Contact information: [address] [place] [address] [phone]            Contact information for after-discharge care    Destination    [facility] Preferred SNF .   Service: Skilled Nursing Contact information: [address] [place] [address] [phone]                   Time spent on discharge management including discussion of hospital course, clinical condition, outpatient instructions, prescriptions, and follow up with the patient and members of the medical team: >30 minutes  -- Lynden Oxford , PA-C [facility]  10/20/2019, 8:35 AM [phone] M-F: 7am - 4pm

## 2019-10-20 NOTE — Progress Notes (Signed)
Harold Barber to be D/C'd to [facility] per MD order.  Discussed prescriptions and follow up appointments with the patient. Prescriptions given to patient, medication list explained in detail. Pt verbalized understanding.  Allergies as of 10/20/2019       Reactions   Statins    Other reaction(s): Other (See Comments) Medically inappropriate. Advanced dementia        Medication List     TAKE these medications    amoxicillin-clavulanate 875-125 MG tablet Commonly known as: Augmentin Take 1 tablet by mouth 2 (two) times daily for 7 days.   donepezil 10 MG tablet Commonly known as: ARICEPT Take 1 tablet by mouth daily.   ibuprofen 600 MG tablet Commonly known as: ADVIL Take 1 tablet (600 mg total) by mouth every 6 (six) hours as needed.   memantine 5 MG tablet Commonly known as: NAMENDA Take 5 mg by mouth 2 (two) times daily.   multivitamin with minerals Tabs tablet Take 1 tablet by mouth daily.   sulfamethoxazole-trimethoprim 800-160 MG tablet Commonly known as: BACTRIM DS Take 1 tablet by mouth in the morning and at bedtime.   traMADol 50 MG tablet Commonly known as: Ultram Take 1 tablet (50 mg total) by mouth every 6 (six) hours as needed.   vitamin B-12 250 MCG tablet Commonly known as: CYANOCOBALAMIN Take 250 mcg by mouth daily.        Vitals:   10/20/19 0549 10/20/19 1455  BP: 129/79 127/71  Pulse: 73 80  Resp: 16   Temp: 98.5 F (36.9 C) 97.9 F (36.6 C)  SpO2: 97% 96%    Skin clean, dry and intact without evidence of skin break down, except for surgical incision with staples. IV catheter discontinued intact. Site without signs and symptoms of complications. Dressing and pressure applied. Pt denies pain at this time. No complaints noted.  An After Visit Summary was printed and given to the patient. EMS picked up patient and is transporting him to [facility].  Harold Barber A Clarrissa Shimkus

## 2019-10-20 NOTE — TOC Transition Note (Signed)
Transition of Care [facility]) - CM/SW Discharge Note   Patient Details  Name: Harold Barber MRN: [id number] Date of Birth: Jul 14, 1930  Transition [facility]) CM/SW Contact:  Chapman Fitch, RN Phone Number: 10/20/2019, 3:00 PM   Clinical Narrative:    Patient to discharge today to [facility] DC info sent in the [facility] Family notified EMS packet printed.  Bedside RN to call report Insurance auth obtained  EMS transport arranged    Final next level of care: Skilled Nursing Facility Barriers to Discharge: No Barriers Identified   Patient Goals and CMS Choice        Discharge Placement              Patient chooses bed at: [facility] Patient to be transferred to facility by: EMS Name of family member notified: Darl Pikes Patient and family notified of of transfer: 10/20/19  Discharge Plan and Services                                     Social Determinants of Health (SDOH) Interventions     Readmission Risk Interventions No flowsheet data found.

## 2019-10-22 NOTE — Progress Notes (Signed)
Please note, sepsis ruled out

## 2019-10-28 ENCOUNTER — Encounter: Payer: Self-pay | Admitting: Physician Assistant

## 2019-10-28 ENCOUNTER — Other Ambulatory Visit: Payer: Self-pay

## 2019-10-28 ENCOUNTER — Ambulatory Visit (INDEPENDENT_AMBULATORY_CARE_PROVIDER_SITE_OTHER): Payer: Medicare Other | Admitting: Physician Assistant

## 2019-10-28 VITALS — BP 127/66 | HR 75 | Temp 98.3°F | Resp 12 | Ht 66.0 in

## 2019-10-28 DIAGNOSIS — K5720 Diverticulitis of large intestine with perforation and abscess without bleeding: Secondary | ICD-10-CM

## 2019-10-28 DIAGNOSIS — Z09 Encounter for follow-up examination after completed treatment for conditions other than malignant neoplasm: Secondary | ICD-10-CM

## 2019-10-28 NOTE — Progress Notes (Signed)
[facility] POST-OP OFFICE VISIT  10/28/2019  HPI: Harold Barber is a 86 y.o. male 15 days s/p Hartman's Procedure for perforated diverticulitis with Dr Aleen Campi  Overall doing reasonably well from a surgical perspective  Family's main complaints are decreased PO intake and lack of willingness to participate in therapies.He will take a few bites of food but otherwise does not eat much. They have been trying Ensure as well but he has not been willing to eat nor drink much.   No complaints of abdominal pain, nausea, emesis, fever, chills His colostomy continues to function well without issues or concerns His incision is healing well, staples remain in place, no erythema or drainage  Otherwise no additional complaints this morning.    Vital signs: There were no vitals taken for this visit.   Physical Exam: Constitutional: Debilitated appearing male, sitting in wheelchair, family at bedside Abdomen: Soft, I do not appreciate any tenderness, non-distended, no rebound/guarding. Colostomy in left mid abdomen, patent, gas and stool in bag Skin: Laparotomy incision is healing well, staples removed, no erythema or drianage  Assessment/Plan: This is a 86 y.o. male 15 days s/p Hartman's Procedure for perforated diverticulitis    - Discussed difficulties with decreased appetite after major illness/surgeyr/hospitlization. I encouraged them to continue with nutritional supplementation as much as possible but this will most likely continue to be limited secondary to patient participation. I briefly discussed role for appetite stimulants much further in the post-op course, but I am not sure this would be the best option given his baseline status.   - Staples removed at bedside, steri-strips placed. Reviewed wound care  - Continue to work with therapies at SNF as much as feasible  - Maintain colostomy, monitor function/output  - reviewed pathology: Perforated diverticulitis with  abscess, negative for malignancy  - Will RTC in 2 weeks with myself or Dr Aleen Campi for re-check  -- Lynden Oxford, PA-C [facility] 10/28/2019, 10:13 AM [phone] M-F: 7am - 4pm

## 2019-10-28 NOTE — Patient Instructions (Signed)
We removed staples today and applied steri strips. These will begin to fall off withinn 10-14 days. Please see your follow up appointment listed below.

## 2019-11-11 ENCOUNTER — Encounter: Payer: Medicare Other | Admitting: Surgery

## 2019-11-23 DEATH — deceased
